# Patient Record
Sex: Female | Born: 1965 | Race: Black or African American | Hispanic: No | Smoking: Never smoker
Health system: Southern US, Community
[De-identification: ages and names within clinical notes are randomized; demographics above are authoritative.]

## PROBLEM LIST (undated history)

## (undated) DIAGNOSIS — N6019 Diffuse cystic mastopathy of unspecified breast: Secondary | ICD-10-CM

## (undated) DIAGNOSIS — E079 Disorder of thyroid, unspecified: Secondary | ICD-10-CM

## (undated) DIAGNOSIS — Q2112 Patent foramen ovale: Secondary | ICD-10-CM

## (undated) HISTORY — PX: OTHER SURGICAL HISTORY: SHX169

---

## 1998-03-12 ENCOUNTER — Other Ambulatory Visit: Admission: RE | Admit: 1998-03-12 | Discharge: 1998-03-12 | Payer: Self-pay | Admitting: Obstetrics & Gynecology

## 1998-08-23 ENCOUNTER — Inpatient Hospital Stay (HOSPITAL_COMMUNITY): Admission: AD | Admit: 1998-08-23 | Discharge: 1998-08-23 | Payer: Self-pay | Admitting: Obstetrics

## 1998-09-15 ENCOUNTER — Inpatient Hospital Stay (HOSPITAL_COMMUNITY): Admission: AD | Admit: 1998-09-15 | Discharge: 1998-09-15 | Payer: Self-pay | Admitting: *Deleted

## 1998-09-15 ENCOUNTER — Inpatient Hospital Stay (HOSPITAL_COMMUNITY): Admission: AD | Admit: 1998-09-15 | Discharge: 1998-09-18 | Payer: Self-pay | Admitting: Obstetrics and Gynecology

## 1999-09-03 ENCOUNTER — Other Ambulatory Visit: Admission: RE | Admit: 1999-09-03 | Discharge: 1999-09-03 | Payer: Self-pay | Admitting: Emergency Medicine

## 2001-03-18 ENCOUNTER — Encounter: Payer: Self-pay | Admitting: Internal Medicine

## 2001-03-18 ENCOUNTER — Emergency Department (HOSPITAL_COMMUNITY): Admission: EM | Admit: 2001-03-18 | Discharge: 2001-03-18 | Payer: Self-pay | Admitting: Emergency Medicine

## 2001-05-11 ENCOUNTER — Encounter: Payer: Self-pay | Admitting: Emergency Medicine

## 2001-05-11 ENCOUNTER — Encounter: Admission: RE | Admit: 2001-05-11 | Discharge: 2001-05-11 | Payer: Self-pay | Admitting: Emergency Medicine

## 2001-08-26 ENCOUNTER — Emergency Department (HOSPITAL_COMMUNITY): Admission: EM | Admit: 2001-08-26 | Discharge: 2001-08-26 | Payer: Self-pay | Admitting: Emergency Medicine

## 2002-11-09 ENCOUNTER — Encounter: Payer: Self-pay | Admitting: Emergency Medicine

## 2002-11-09 ENCOUNTER — Emergency Department (HOSPITAL_COMMUNITY): Admission: EM | Admit: 2002-11-09 | Discharge: 2002-11-09 | Payer: Self-pay | Admitting: Emergency Medicine

## 2003-11-07 ENCOUNTER — Emergency Department (HOSPITAL_COMMUNITY): Admission: EM | Admit: 2003-11-07 | Discharge: 2003-11-07 | Payer: Self-pay | Admitting: Emergency Medicine

## 2003-11-08 ENCOUNTER — Emergency Department (HOSPITAL_COMMUNITY): Admission: EM | Admit: 2003-11-08 | Discharge: 2003-11-08 | Payer: Self-pay | Admitting: Emergency Medicine

## 2004-06-26 ENCOUNTER — Emergency Department (HOSPITAL_COMMUNITY): Admission: EM | Admit: 2004-06-26 | Discharge: 2004-06-26 | Payer: Self-pay | Admitting: Family Medicine

## 2005-07-07 ENCOUNTER — Encounter: Admission: RE | Admit: 2005-07-07 | Discharge: 2005-07-07 | Payer: Self-pay | Admitting: *Deleted

## 2005-07-17 ENCOUNTER — Encounter: Admission: RE | Admit: 2005-07-17 | Discharge: 2005-07-17 | Payer: Self-pay | Admitting: Emergency Medicine

## 2005-07-20 ENCOUNTER — Encounter: Payer: Self-pay | Admitting: Internal Medicine

## 2007-05-03 ENCOUNTER — Encounter: Payer: Self-pay | Admitting: Internal Medicine

## 2007-07-31 ENCOUNTER — Encounter: Payer: Self-pay | Admitting: Internal Medicine

## 2007-08-14 ENCOUNTER — Encounter: Payer: Self-pay | Admitting: Internal Medicine

## 2007-10-11 ENCOUNTER — Encounter: Admission: RE | Admit: 2007-10-11 | Discharge: 2007-10-11 | Payer: Self-pay | Admitting: Emergency Medicine

## 2007-10-24 ENCOUNTER — Encounter: Payer: Self-pay | Admitting: Internal Medicine

## 2007-10-24 ENCOUNTER — Encounter: Admission: RE | Admit: 2007-10-24 | Discharge: 2007-10-24 | Payer: Self-pay | Admitting: Emergency Medicine

## 2008-09-26 ENCOUNTER — Encounter: Payer: Self-pay | Admitting: Internal Medicine

## 2008-10-02 ENCOUNTER — Encounter: Payer: Self-pay | Admitting: Internal Medicine

## 2008-10-02 LAB — CONVERTED CEMR LAB
BUN: 11 mg/dL
Calcium: 9.2 mg/dL
Cholesterol: 138 mg/dL
Creatinine, Ser: 0.69 mg/dL
HCT: 33.6 %
Lymphocytes, automated: 28 %
Monocytes Relative: 9 %
Platelets: 203 10*3/uL
RBC: 3.71 M/uL
Triglyceride fasting, serum: 72 mg/dL

## 2008-12-15 ENCOUNTER — Encounter: Payer: Self-pay | Admitting: Internal Medicine

## 2008-12-15 ENCOUNTER — Encounter: Admission: RE | Admit: 2008-12-15 | Discharge: 2008-12-15 | Payer: Self-pay | Admitting: Emergency Medicine

## 2008-12-26 ENCOUNTER — Ambulatory Visit: Payer: Self-pay | Admitting: Internal Medicine

## 2009-01-22 ENCOUNTER — Encounter: Payer: Self-pay | Admitting: Internal Medicine

## 2009-01-22 DIAGNOSIS — N920 Excessive and frequent menstruation with regular cycle: Secondary | ICD-10-CM

## 2009-01-22 DIAGNOSIS — D259 Leiomyoma of uterus, unspecified: Secondary | ICD-10-CM | POA: Insufficient documentation

## 2009-01-22 DIAGNOSIS — J309 Allergic rhinitis, unspecified: Secondary | ICD-10-CM | POA: Insufficient documentation

## 2009-02-17 ENCOUNTER — Ambulatory Visit: Payer: Self-pay | Admitting: Internal Medicine

## 2009-04-01 ENCOUNTER — Ambulatory Visit: Payer: Self-pay | Admitting: Internal Medicine

## 2009-04-01 DIAGNOSIS — L258 Unspecified contact dermatitis due to other agents: Secondary | ICD-10-CM

## 2009-07-20 ENCOUNTER — Ambulatory Visit: Payer: Self-pay | Admitting: Internal Medicine

## 2009-07-22 ENCOUNTER — Encounter: Payer: Self-pay | Admitting: Internal Medicine

## 2009-12-04 ENCOUNTER — Encounter: Payer: Self-pay | Admitting: Internal Medicine

## 2010-04-06 ENCOUNTER — Ambulatory Visit (HOSPITAL_COMMUNITY): Admission: RE | Admit: 2010-04-06 | Discharge: 2010-04-06 | Payer: Self-pay | Admitting: Internal Medicine

## 2010-10-02 ENCOUNTER — Encounter: Payer: Self-pay | Admitting: Emergency Medicine

## 2010-10-03 ENCOUNTER — Encounter: Payer: Self-pay | Admitting: Emergency Medicine

## 2010-10-10 LAB — CONVERTED CEMR LAB

## 2010-10-12 NOTE — Letter (Signed)
Summary: [facility]  [facility]   Imported By: Sherian Rein 12/09/2009 07:52:54  _____________________________________________________________________  External Attachment:    Type:   Image     Comment:   External Document

## 2011-06-06 ENCOUNTER — Other Ambulatory Visit (HOSPITAL_COMMUNITY): Payer: Self-pay | Admitting: Obstetrics and Gynecology

## 2011-06-06 DIAGNOSIS — Z1231 Encounter for screening mammogram for malignant neoplasm of breast: Secondary | ICD-10-CM

## 2011-06-10 ENCOUNTER — Ambulatory Visit (HOSPITAL_COMMUNITY): Payer: Self-pay | Attending: Obstetrics and Gynecology

## 2011-06-13 ENCOUNTER — Other Ambulatory Visit (HOSPITAL_COMMUNITY): Payer: Self-pay | Admitting: Obstetrics and Gynecology

## 2011-06-13 DIAGNOSIS — Z1231 Encounter for screening mammogram for malignant neoplasm of breast: Secondary | ICD-10-CM

## 2011-06-24 ENCOUNTER — Ambulatory Visit (HOSPITAL_COMMUNITY)
Admission: RE | Admit: 2011-06-24 | Discharge: 2011-06-24 | Disposition: A | Discharge status: Home / Self Care | Payer: Self-pay | Source: Ambulatory Visit | Attending: Obstetrics and Gynecology | Admitting: Obstetrics and Gynecology

## 2011-06-24 DIAGNOSIS — Z1231 Encounter for screening mammogram for malignant neoplasm of breast: Secondary | ICD-10-CM

## 2011-10-07 DIAGNOSIS — Z0289 Encounter for other administrative examinations: Secondary | ICD-10-CM

## 2011-10-07 DIAGNOSIS — Z111 Encounter for screening for respiratory tuberculosis: Secondary | ICD-10-CM

## 2011-10-10 ENCOUNTER — Encounter (INDEPENDENT_AMBULATORY_CARE_PROVIDER_SITE_OTHER): Payer: Self-pay

## 2011-10-10 DIAGNOSIS — Z0389 Encounter for observation for other suspected diseases and conditions ruled out: Secondary | ICD-10-CM

## 2011-12-19 ENCOUNTER — Encounter: Payer: Self-pay | Admitting: Family

## 2011-12-19 ENCOUNTER — Inpatient Hospital Stay (HOSPITAL_COMMUNITY): Payer: Medicaid Other

## 2011-12-19 ENCOUNTER — Inpatient Hospital Stay (HOSPITAL_COMMUNITY)
Admission: AD | Admit: 2011-12-19 | Discharge: 2011-12-19 | Disposition: A | Discharge status: Home / Self Care | Payer: Medicaid Other | Source: Ambulatory Visit | Attending: Obstetrics and Gynecology | Admitting: Obstetrics and Gynecology

## 2011-12-19 ENCOUNTER — Encounter (HOSPITAL_COMMUNITY): Payer: Self-pay | Admitting: *Deleted

## 2011-12-19 DIAGNOSIS — N920 Excessive and frequent menstruation with regular cycle: Secondary | ICD-10-CM

## 2011-12-19 DIAGNOSIS — D259 Leiomyoma of uterus, unspecified: Secondary | ICD-10-CM

## 2011-12-19 DIAGNOSIS — N938 Other specified abnormal uterine and vaginal bleeding: Secondary | ICD-10-CM | POA: Insufficient documentation

## 2011-12-19 DIAGNOSIS — N949 Unspecified condition associated with female genital organs and menstrual cycle: Secondary | ICD-10-CM | POA: Insufficient documentation

## 2011-12-19 DIAGNOSIS — R109 Unspecified abdominal pain: Secondary | ICD-10-CM | POA: Insufficient documentation

## 2011-12-19 HISTORY — DX: Diffuse cystic mastopathy of unspecified breast: N60.19

## 2011-12-19 LAB — URINALYSIS, ROUTINE W REFLEX MICROSCOPIC
Bilirubin Urine: NEGATIVE
Ketones, ur: NEGATIVE mg/dL
Nitrite: NEGATIVE
Protein, ur: NEGATIVE mg/dL
pH: 7.5 (ref 5.0–8.0)

## 2011-12-19 LAB — CBC
HCT: 32 % — ABNORMAL LOW (ref 36.0–46.0)
Hemoglobin: 10.2 g/dL — ABNORMAL LOW (ref 12.0–15.0)
MCH: 28.7 pg (ref 26.0–34.0)
MCHC: 31.9 g/dL (ref 30.0–36.0)
MCV: 89.9 fL (ref 78.0–100.0)
RBC: 3.56 MIL/uL — ABNORMAL LOW (ref 3.87–5.11)

## 2011-12-19 LAB — WET PREP, GENITAL: Clue Cells Wet Prep HPF POC: NONE SEEN

## 2011-12-19 LAB — URINE MICROSCOPIC-ADD ON

## 2011-12-19 MED ORDER — MEDROXYPROGESTERONE ACETATE 10 MG PO TABS: 10.0000 mg | ORAL_TABLET | Freq: Every day | ORAL | Status: DC

## 2011-12-19 NOTE — Discharge Instructions (Signed)
Abnormal Uterine Bleeding Abnormal uterine bleeding can have many causes. Some cases are simply treated, while others are more serious. There are several kinds of bleeding that is considered abnormal, including:  Bleeding between periods.   Bleeding after sexual intercourse.   Spotting anytime in the menstrual cycle.   Bleeding heavier or more than normal.   Bleeding after menopause.  CAUSES  There are many causes of abnormal uterine bleeding. It can be present in teenagers, pregnant women, women during their reproductive years, and women who have reached menopause. Your caregiver will look for the more common causes depending on your age, signs, symptoms and your particular circumstance. Most cases are not serious and can be treated. Even the more serious causes, like cancer of the female organs, can be treated adequately if found in the early stages. That is why all types of bleeding should be evaluated and treated as soon as possible. DIAGNOSIS  Diagnosing the cause may take several kinds of tests. Your caregiver may:  Take a complete history of the type of bleeding.   Perform a complete physical exam and Pap smear.   Take an ultrasound on the abdomen showing a picture of the female organs and the pelvis.   Inject dye into the uterus and Fallopian tubes and X-ray them (hysterosalpingogram).   Place fluid in the uterus and do an ultrasound (sonohysterogrqphy).   Take a CT scan to examine the female organs and pelvis.   Take an MRI to examine the female organs and pelvis. There is no X-ray involved with this procedure.   Look inside the uterus with a telescope that has a light at the end (hysteroscopy).   Scrap the inside of the uterus to get tissue to examine (Dilatation and Curettage, D&C).   Look into the pelvis with a telescope that has a light at the end (laparoscopy). This is done through a very small cut (incision) in the abdomen.  TREATMENT  Treatment will depend on the  cause of the abnormal bleeding. It can include:  Doing nothing to allow the problem to take care of itself over time.   Hormone treatment.   Birth control pills.   Treating the medical condition causing the problem.   Laparoscopy.   Major or minor surgery   Destroying the lining of the uterus with electrical currant, laser, freezing or heat (uterine ablation).  HOME CARE INSTRUCTIONS   Follow your caregiver's recommendation on how to treat your problem.   See your caregiver if you missed a menstrual period and think you may be pregnant.   If you are bleeding heavily, count the number of pads/tampons you use and how often you have to change them. Tell this to your caregiver.   Avoid sexual intercourse until the problem is controlled.  SEEK MEDICAL CARE IF:   You have any kind of abnormal bleeding mentioned above.   You feel dizzy at times.   You are 46 years old and have not had a menstrual period yet.  SEEK IMMEDIATE MEDICAL CARE IF:   You pass out.   You are changing pads/tampons every 15 to 30 minutes.   You have belly (abdominal) pain.   You have a temperature of 100 F (37.8 C) or higher.   You become sweaty or weak.   You are passing large blood clots from the vagina.   You start to feel sick to your stomach (nauseous) and throw up (vomit).  Document Released: 08/29/2005 Document Revised: 08/18/2011 Document Reviewed: 01/22/2009 ExitCare   Patient Information 2012 [employer].  Fibroids You have been diagnosed as having a fibroid. Fibroids are smooth muscle lumps (tumors) which can occur any place in a woman's body. They are usually in the womb (uterus). The most common problem (symptom) of fibroids is bleeding. Over time this may cause low red blood cells (anemia). Other symptoms include feelings of pressure and pain in the pelvis. The diagnosis (learning what is wrong) of fibroids is made by physical exam. Sometimes tests such as an ultrasound are used.  This is helpful when fibroids are felt around the ovaries and to look for tumors. TREATMENT   Most fibroids do not need surgical or medical treatment. Sometimes a tissue sample (biopsy) of the lining of the uterus is done to rule out cancer. If there is no cancer and only a small amount of bleeding, the problem can be watched.   Hormonal treatment can improve the problem.   When surgery is needed, it can consist of removing the fibroid. Vaginal birth may not be possible after the removal of fibroids. This depends on where they are and the extent of surgery. When pregnancy occurs with fibroids it is usually normal.   Your caregiver can help decide which treatments are best for you.  HOME CARE INSTRUCTIONS   Do not use aspirin as this may increase bleeding problems.   If your periods (menses) are heavy, record the number of pads or tampons used per month. Bring this information to your caregiver. This can help them determine the best treatment for you.  SEEK IMMEDIATE MEDICAL CARE IF:  You have pelvic pain or cramps not controlled with medications, or experience a sudden increase in pain.   You have an increase of pelvic bleeding between and during menses.   You feel lightheaded or have fainting spells.   You develop worsening belly (abdominal) pain.  Document Released: 08/26/2000 Document Revised: 08/18/2011 Document Reviewed: 04/17/2008 ExitCare Patient Information 2012 [employer]. 

## 2011-12-19 NOTE — MAU Provider Note (Signed)
History     CSN: [id number]  Arrival date and time: 12/19/11 1220   First Provider Initiated Contact with Patient 12/19/11 1330     46 y.o.G2P1011 Chief Complaint  Patient presents with  . Vaginal Bleeding   HPI Pt presents with heavy periods x1 year, increasing in length and with clots.  Today she presents with increased abdominal pain and clots with current period.  She denies dizziness, h/a, n/v, urinary symptoms, vaginal itching/burning, or fever/chills.   OB History    Grav Para Term Preterm Abortions TAB SAB Ect Mult Living   2 1 1  0 1 1 0 0 0 1      Past Medical History  Diagnosis Date  . Fibrocystic breast disease     Past Surgical History  Procedure Date  . Removal breast mass     Benign    History reviewed. No pertinent family history.  History  Substance Use Topics  . Smoking status: Never Smoker   . Smokeless tobacco: Never Used  . Alcohol Use: No    Allergies: No Known Allergies  Prescriptions prior to admission  Medication Sig Dispense Refill  . Calcium-Vitamin D (CALTRATE 600 PLUS-VIT D PO) Take 1 tablet by mouth daily.      Marland Kitchen ibuprofen (ADVIL,MOTRIN) 200 MG tablet Take 800 mg by mouth every 6 (six) hours as needed. For pain.      . Multiple Vitamin (MULITIVITAMIN WITH MINERALS) TABS Take 1 tablet by mouth daily.        Review of Systems  Constitutional: Negative for fever, chills and malaise/fatigue.  Eyes: Negative for blurred vision.  Respiratory: Negative for cough and shortness of breath.   Cardiovascular: Negative for chest pain.  Gastrointestinal: Positive for abdominal pain. Negative for heartburn, nausea and vomiting.  Genitourinary: Negative for dysuria, urgency and frequency.  Musculoskeletal: Negative.   Neurological: Negative for dizziness and headaches.  Psychiatric/Behavioral: Negative for depression.   Physical Exam   Blood pressure 121/75, pulse 77, temperature 98.1 F (36.7 C), temperature source Oral, resp. rate 18,  height 5\' 8"  (1.727 m), weight 83.689 kg (184 lb 8 oz).  Physical Exam  Nursing note and vitals reviewed. Constitutional: She is oriented to person, place, and time. She appears well-developed and well-nourished.  Neck: Normal range of motion.  Cardiovascular: Normal rate.   Respiratory: Effort normal.  GI: Soft.  Genitourinary:       Pelvic exam: Cervix pink, without lesion, visually closed, large amount bright bleeding from cervical os, vaginal walls and external genitalia normal  Bimanual exam: Cervix 0/long/high, soft, anterior, neg CMT, uterus nontender, nonenlarged, adnexa without enlargement or mass   Musculoskeletal: Normal range of motion.  Neurological: She is alert and oriented to person, place, and time.  Skin: Skin is warm and dry.  Psychiatric: She has a normal mood and affect. Her behavior is normal. Judgment and thought content normal.    MAU Course  Procedures U/A, CBC, Pelvic U/S  Results for orders placed during the hospital encounter of 12/19/11 (from the past 48 hour(s))  URINALYSIS, ROUTINE W REFLEX MICROSCOPIC     Status: Abnormal   Collection Time   12/19/11 12:30 PM      Component Value Range Comment   Color, Urine YELLOW  YELLOW     APPearance CLEAR  CLEAR     Specific Gravity, Urine 1.020  1.005 - 1.030     pH 7.5  5.0 - 8.0     Glucose, UA NEGATIVE  NEGATIVE (mg/dL)  Hgb urine dipstick LARGE (*) NEGATIVE     Bilirubin Urine NEGATIVE  NEGATIVE     Ketones, ur NEGATIVE  NEGATIVE (mg/dL)    Protein, ur NEGATIVE  NEGATIVE (mg/dL)    Urobilinogen, UA 0.2  0.0 - 1.0 (mg/dL)    Nitrite NEGATIVE  NEGATIVE     Leukocytes, UA NEGATIVE  NEGATIVE    URINE MICROSCOPIC-ADD ON     Status: Normal   Collection Time   12/19/11 12:30 PM      Component Value Range Comment   Squamous Epithelial / LPF RARE  RARE     RBC / HPF TOO NUMEROUS TO COUNT  <3 (RBC/hpf)   POCT PREGNANCY, URINE     Status: Normal   Collection Time   12/19/11 12:38 PM      Component Value  Range Comment   Preg Test, Ur NEGATIVE  NEGATIVE    CBC     Status: Abnormal   Collection Time   12/19/11 12:40 PM      Component Value Range Comment   WBC 6.1  4.0 - 10.5 (K/uL)    RBC 3.56 (*) 3.87 - 5.11 (MIL/uL)    Hemoglobin 10.2 (*) 12.0 - 15.0 (g/dL)    HCT 16.1 (*) 09.6 - 46.0 (%)    MCV 89.9  78.0 - 100.0 (fL)    MCH 28.7  26.0 - 34.0 (pg)    MCHC 31.9  30.0 - 36.0 (g/dL)    RDW 04.5  40.9 - 81.1 (%)    Platelets 233  150 - 400 (K/uL)   WET PREP, GENITAL     Status: Abnormal   Collection Time   12/19/11  1:00 PM      Component Value Range Comment   Yeast Wet Prep HPF POC NONE SEEN  NONE SEEN     Trich, Wet Prep NONE SEEN  NONE SEEN     Clue Cells Wet Prep HPF POC NONE SEEN  NONE SEEN     WBC, Wet Prep HPF POC FEW (*) NONE SEEN  FEW BACTERIA SEEN  GC/CHLAMYDIA PROBE AMP, GENITAL     Status: Normal   Collection Time   12/19/11  1:33 PM      Component Value Range Comment   GC Probe Amp, Genital NEGATIVE  NEGATIVE     Chlamydia, DNA Probe NEGATIVE  NEGATIVE     US Transvaginal Non-ob  12/19/2011  *RADIOLOGY REPORT*  Clinical Data: Heavy vaginal bleeding.  Fibroids.  LMP 11/15/2011.  TRANSABDOMINAL AND TRANSVAGINAL ULTRASOUND OF PELVIS  Technique:  Both transabdominal and transvaginal ultrasound examinations of the pelvis were performed.  Transabdominal technique was performed for global imaging of the pelvis including uterus, ovaries, adnexal regions, and pelvic cul-de-sac.  It was necessary to proceed with endovaginal exam following the transabdominal exam to visualize the endometrium and individual fibroids.  Comparison:  None.  Findings: Uterus:  10.2 x 6.2 x 8.1 cm. Diffusely heterogeneous myometrial echotexture is seen, with at least four discretely measurable fibroids.  These range in size from 1.6 cm to 4.4 cm in maximum diameter.  Endometrium: Poorly visualized due to acoustic shadowing from fibroids described above.  Right ovary: 2.4 x 1.4 x 1.2 cm. Normal appearance.  Left  ovary: 3.0 x 1.3 x 1.8 cm.  Normal appearance.  Other Findings:  Trace amount of free fluid noted in pelvic cul-de- sac.  IMPRESSION:  1.  Multiple small uterine fibroids, largest measuring 4.4 cm. 2.  No evidence of adnexal mass.  Original  Report Authenticated By: Danae Orleans, M.D.   US Pelvis Complete  12/19/2011  *RADIOLOGY REPORT*  Clinical Data: Heavy vaginal bleeding.  Fibroids.  LMP 11/15/2011.  TRANSABDOMINAL AND TRANSVAGINAL ULTRASOUND OF PELVIS  Technique:  Both transabdominal and transvaginal ultrasound examinations of the pelvis were performed.  Transabdominal technique was performed for global imaging of the pelvis including uterus, ovaries, adnexal regions, and pelvic cul-de-sac.  It was necessary to proceed with endovaginal exam following the transabdominal exam to visualize the endometrium and individual fibroids.  Comparison:  None.  Findings: Uterus:  10.2 x 6.2 x 8.1 cm. Diffusely heterogeneous myometrial echotexture is seen, with at least four discretely measurable fibroids.  These range in size from 1.6 cm to 4.4 cm in maximum diameter.  Endometrium: Poorly visualized due to acoustic shadowing from fibroids described above.  Right ovary: 2.4 x 1.4 x 1.2 cm. Normal appearance.  Left ovary: 3.0 x 1.3 x 1.8 cm.  Normal appearance.  Other Findings:  Trace amount of free fluid noted in pelvic cul-de- sac.  IMPRESSION:  1.  Multiple small uterine fibroids, largest measuring 4.4 cm. 2.  No evidence of adnexal mass.  Original Report Authenticated By: Danae Orleans, M.D.    Assessment and Plan  A: Uterine fibroids Menorrhagia  P: D/C home with bleeding precautions Provera 10 mg PO QD for 30 days or until f/u F/U at [facility] clinic (message sent) Return to MAU as needed  LEFTWICH-KIRBY, Athony Coppa 12/19/2011, 1:44 PM

## 2011-12-19 NOTE — MAU Note (Signed)
Patient is here with c/o vaginal bleeding for one year, heacier today with clots. She states that she is having mild cramping. Denies dizziness or n/v.

## 2011-12-20 LAB — GC/CHLAMYDIA PROBE AMP, GENITAL: Chlamydia, DNA Probe: NEGATIVE

## 2011-12-21 NOTE — MAU Provider Note (Signed)
Agree with above note.  Shelly Dickson 12/21/2011 8:40 AM

## 2012-01-25 ENCOUNTER — Other Ambulatory Visit (HOSPITAL_COMMUNITY)
Admission: RE | Admit: 2012-01-25 | Discharge: 2012-01-25 | Disposition: A | Discharge status: Home / Self Care | Payer: Self-pay | Source: Ambulatory Visit | Attending: Family | Admitting: Family

## 2012-01-25 ENCOUNTER — Ambulatory Visit (INDEPENDENT_AMBULATORY_CARE_PROVIDER_SITE_OTHER): Payer: Self-pay | Admitting: Family

## 2012-01-25 ENCOUNTER — Encounter: Payer: Self-pay | Admitting: Family

## 2012-01-25 VITALS — BP 108/71 | HR 69 | Temp 98.4°F | Ht 68.5 in | Wt 184.7 lb

## 2012-01-25 DIAGNOSIS — Z01812 Encounter for preprocedural laboratory examination: Secondary | ICD-10-CM

## 2012-01-25 DIAGNOSIS — R58 Hemorrhage, not elsewhere classified: Secondary | ICD-10-CM | POA: Insufficient documentation

## 2012-01-25 LAB — POCT PREGNANCY, URINE: Preg Test, Ur: NEGATIVE

## 2012-01-25 MED ORDER — NORGESTIMATE-ETH ESTRADIOL 0.25-35 MG-MCG PO TABS: 1.0000 | ORAL_TABLET | Freq: Every day | ORAL | Status: DC

## 2012-01-25 NOTE — Progress Notes (Signed)
  Subjective:    Shelly Dickson is a 46 y.o. female who presents with uterine fibroids. Periods are regular every 28-30 days, lasting 5 days. Dysmenorrhea:severe, occurring first 1-2 days of flow. Cyclic symptoms include bloating and irritability. No intermenstrual bleeding, spotting, or discharge.  Current contraception: none History of abnormal Pap smear: no Family history of uterine or ovarian cancer: no Regular self breast exam: not every month History of abnormal mammogram: no Family history of breast cancer: yes - maternal grandmother (22's) History of abnormal lipids: no  Menstrual History: OB History    Grav Para Term Preterm Abortions TAB SAB Ect Mult Living   2 1 1  0 1 1 0 0 0 1      Menarche age: 41 Patient's last menstrual period was 01/09/2012.    The following portions of the patient's history were reviewed and updated as appropriate: allergies, current medications, past family history, past medical history, past social history, past surgical history and problem list.  Review of Systems Pertinent items are noted in HPI.    Objective:     BP 108/71  Pulse 69  Temp(Src) 98.4 F (36.9 C) (Oral)  Ht 5' 8.5" (1.74 m)  Wt 184 lb 11.2 oz (83.779 kg)  BMI 27.67 kg/m2  LMP 01/09/2012 BP 108/71  Pulse 69  Temp(Src) 98.4 F (36.9 C) (Oral)  Ht 5' 8.5" (1.74 m)  Wt 184 lb 11.2 oz (83.779 kg)  BMI 27.67 kg/m2  LMP 01/09/2012 General appearance: alert, cooperative and appears stated age Abdomen: normal findings: soft, non-tender Pelvic: cervix normal in appearance, external genitalia normal, no adnexal masses or tenderness, no cervical motion tenderness, rectovaginal septum normal, vagina normal without discharge and uterus slightly enlarge. Extremities: extremities normal, atraumatic, no cyanosis or edema    HCG - negative  Patient given informed consent, signed copy in the chart, time out was performed. Appropriate time out taken. . The patient was placed in the  lithotomy position and the cervix brought into view with sterile speculum.  Portio of cervix cleansed x 2 with betadine swabs. The uterus was sounded for depth of 8. A pipelle was introduced to into the uterus, suction created,  and an endometrial sample was obtained. All equipment was removed and accounted for.  The patient tolerated the procedure well.    Patient given post procedure instructions. The patient will return in 2 weeks for results.  Assessment:    Symptomatic uterine fibroids.     Plan:    Endometrial biopsy - see separate procedure note. Follow up in 2 weeks. RX Sprintec   [facility]

## 2012-01-25 NOTE — Patient Instructions (Signed)
Endometrial Biopsy This is a test in which a tissue sample (a biopsy) is taken from inside the uterus (womb). It is then looked at by a specialist under a microscope to see if the tissue is normal or abnormal. The endometrium is the lining of the uterus. This test helps determine where you are in your menstrual cycle and how hormone levels are affecting the lining of the uterus. Another use for this test is to diagnose endometrial cancer, tuberculosis, polyps, or inflammatory conditions and to evaluate uterine bleeding. PREPARATION FOR TEST No preparation or fasting is necessary. NORMAL FINDINGS No pathologic conditions. Presence of "secretory-type" endometrium 3 to 5 days before to normal menstruation. Ranges for normal findings may vary among different laboratories and hospitals. You should always check with your doctor after having lab work or other tests done to discuss the meaning of your test results and whether your values are considered within normal limits. MEANING OF TEST  Your caregiver will go over the test results with you and discuss the importance and meaning of your results, as well as treatment options and the need for additional tests if necessary. OBTAINING THE TEST RESULTS It is your responsibility to obtain your test results. Ask the lab or department performing the test when and how you will get your results. Document Released: 12/30/2004 Document Revised: 08/18/2011 Document Reviewed: 08/08/2008 ExitCare Patient Information 2012 [employer]. 

## 2012-01-25 NOTE — Progress Notes (Signed)
Patient only took Provera for 4 days after prescribed. States it caused her to feel "nervous, hot sweats, very fatigued"

## 2012-02-02 ENCOUNTER — Emergency Department (HOSPITAL_COMMUNITY)
Admission: EM | Admit: 2012-02-02 | Discharge: 2012-02-02 | Disposition: A | Discharge status: Home / Self Care | Payer: Self-pay | Attending: Emergency Medicine | Admitting: Emergency Medicine

## 2012-02-02 ENCOUNTER — Telehealth: Payer: Self-pay | Admitting: *Deleted

## 2012-02-02 ENCOUNTER — Encounter (HOSPITAL_COMMUNITY): Payer: Self-pay | Admitting: Emergency Medicine

## 2012-02-02 DIAGNOSIS — L0390 Cellulitis, unspecified: Secondary | ICD-10-CM

## 2012-02-02 DIAGNOSIS — L0201 Cutaneous abscess of face: Secondary | ICD-10-CM | POA: Insufficient documentation

## 2012-02-02 DIAGNOSIS — H9209 Otalgia, unspecified ear: Secondary | ICD-10-CM | POA: Insufficient documentation

## 2012-02-02 DIAGNOSIS — R51 Headache: Secondary | ICD-10-CM | POA: Insufficient documentation

## 2012-02-02 DIAGNOSIS — L03211 Cellulitis of face: Secondary | ICD-10-CM | POA: Insufficient documentation

## 2012-02-02 MED ORDER — HYDROCODONE-ACETAMINOPHEN 5-325 MG PO TABS
1.0000 | ORAL_TABLET | Freq: Once | ORAL | Status: AC
  Administered 2012-02-02: 1 via ORAL
  Filled 2012-02-02: qty 1

## 2012-02-02 MED ORDER — CEPHALEXIN 250 MG PO CAPS: 250.0000 mg | ORAL_CAPSULE | Freq: Four times a day (QID) | ORAL | Status: AC

## 2012-02-02 MED ORDER — CEPHALEXIN 250 MG PO CAPS
250.0000 mg | ORAL_CAPSULE | Freq: Once | ORAL | Status: AC
  Administered 2012-02-02: 250 mg via ORAL
  Filled 2012-02-02: qty 1

## 2012-02-02 MED ORDER — HYDROCODONE-ACETAMINOPHEN 5-325 MG PO TABS: 1.0000 | ORAL_TABLET | Freq: Once | ORAL | Status: AC

## 2012-02-02 NOTE — ED Notes (Addendum)
Tuesday morning she started having swollen ears. Swelling noted with no discoloration on the right ear and right lateral cheek near the ear. Tympanic membrane inspected. Normal ear wax amount noted. No pink or redness. Clear/pearly gray in color.

## 2012-02-02 NOTE — Telephone Encounter (Signed)
Patient called stating would like call back and has a question. No other information given

## 2012-02-02 NOTE — Telephone Encounter (Signed)
Called patient back. She had a questions about her early onset of menstration post endometrial biopsy. Stated that the bleed was in fact a menstration; it was not bright red, states she also had a few clumps of blood and a light cramp. Advised patient that early menstration is ok and as long as she's not changing her pad more than 4 times an hour that she should be ok. Patient states her bleed has slowed down now and will just address any changes on her next f/u appt on 02/24/12. Patient satisfied.

## 2012-02-02 NOTE — ED Provider Notes (Signed)
Medical screening examination/treatment/procedure(s) were performed by non-physician practitioner and as supervising physician I was immediately available for consultation/collaboration.  Dietrich Ke, MD 02/02/12 [id number] 

## 2012-02-02 NOTE — Discharge Instructions (Signed)
Cellulitis Cellulitis is an infection of the skin and the tissue beneath it. The area is typically red and tender. It is caused by germs (bacteria) (usually staph or strep) that enter the body through cuts or sores. Cellulitis most commonly occurs in the arms or lower legs.  HOME CARE INSTRUCTIONS   If you are given a prescription for medications which kill germs (antibiotics), take as directed until finished.   If the infection is on the arm or leg, keep the limb elevated as able.   Use a warm cloth several times per day to relieve pain and encourage healing.   See your caregiver for recheck of the infected site as directed if problems arise.   Only take over-the-counter or prescription medicines for pain, discomfort, or fever as directed by your caregiver.  SEEK MEDICAL CARE IF:   The area of redness (inflammation) is spreading, there are red streaks coming from the infected site, or if a part of the infection begins to turn dark in color.   The joint or bone underneath the infected skin becomes painful after the skin has healed.   The infection returns in the same or another area after it seems to have gone away.   A boil or bump swells up. This may be an abscess.   New, unexplained problems such as pain or fever develop.  SEEK IMMEDIATE MEDICAL CARE IF:   You have a fever.   You or your child feels drowsy or lethargic.   There is vomiting, diarrhea, or lasting discomfort or feeling ill (malaise) with muscle aches and pains.  MAKE SURE YOU:   Understand these instructions.   Will watch your condition.   Will get help right away if you are not doing well or get worse.  Document Released: 06/08/2005 Document Revised: 08/18/2011 Document Reviewed: 04/16/2008 ExitCare Patient Information 2012 [employer].Skin Infections A skin infection usually develops as a result of disruption of the skin barrier.  CAUSES  A skin infection might occur following:  Trauma or an injury  to the skin such as a cut or insect sting.   Inflammation (as in eczema).   Breaks in the skin between the toes (as in athlete's foot).   Swelling (edema).  SYMPTOMS  The legs are the most common site affected. Usually there is:  Redness.   Swelling.   Pain.   There may be red streaks in the area of the infection.  TREATMENT   Minor skin infections may be treated with topical antibiotics, but if the skin infection is severe, hospital care and intravenous (IV) antibiotic treatment may be needed.   Most often skin infections can be treated with oral antibiotic medicine as well as proper rest and elevation of the affected area until the infection improves.   If you are prescribed oral antibiotics, it is important to take them as directed and to take all the pills even if you feel better before you have finished all of the medicine.   You may apply warm compresses to the area for 20-30 minutes 4 times daily.  You might need a tetanus shot now if:  You have no idea when you had the last one.   You have never had a tetanus shot before.   Your wound had dirt in it.  If you need a tetanus shot and you decide not to get one, there is a rare chance of getting tetanus. Sickness from tetanus can be serious. If you get a tetanus shot, your   arm may swell and become red and warm at the shot site. This is common and not a problem. SEEK MEDICAL CARE IF:  The pain and swelling from your infection do not improve within 2 days.  SEEK IMMEDIATE MEDICAL CARE IF:  You develop a fever, chills, or other serious problems.  Document Released: 10/06/2004 Document Revised: 08/18/2011 Document Reviewed: 08/18/2008 ExitCare Patient Information 2012 [employer]. 

## 2012-02-02 NOTE — ED Provider Notes (Signed)
History     CSN: [id number]  Arrival date & time 02/02/12  2010   First MD Initiated Contact with Patient 02/02/12 2136      Chief Complaint  Patient presents with  . Otalgia    (Consider location/radiation/quality/duration/timing/severity/associated sxs/prior treatment) HPI Comments: Patient here with a two day history of right ear pain - she reports that the pain started on Tuesday, was there when she awoke.  She reports pain to the tragus of the right ear and into the right face with redness and swelling - reports pain with opening and closing her jaw.  Denies fever, chills, drainage from the ear, reports headache and pain behind her right eye.  Denies any vision changes but reports decrease in hearing on the right.  Patient is a 46 y.o. female presenting with ear pain. The history is provided by the patient. No language interpreter was used.  Otalgia This is a new problem. The current episode started 2 days ago. There is pain in the right ear. The problem occurs constantly. The problem has been gradually worsening. There has been no fever. The pain is at a severity of 10/10. The pain is severe. Associated symptoms include headaches and hearing loss. Pertinent negatives include no ear discharge, no rhinorrhea, no sore throat, no abdominal pain, no diarrhea, no vomiting, no neck pain, no cough and no rash. Her past medical history does not include chronic ear infection, hearing loss or tympanostomy tube.    Past Medical History  Diagnosis Date  . Fibrocystic breast disease     Past Surgical History  Procedure Date  . Removal breast mass     Benign    Family History  Problem Relation Age of Onset  . Breast cancer Paternal Grandmother   . Breast cancer Maternal Grandmother   . Hypertension Maternal Grandmother   . Diabetes Maternal Grandfather   . Hypertension Maternal Grandfather   . Hypertension Brother   . Hypertension Sister     History  Substance Use Topics  .  Smoking status: Never Smoker   . Smokeless tobacco: Never Used  . Alcohol Use: No    OB History    Grav Para Term Preterm Abortions TAB SAB Ect Mult Living   2 1 1  0 1 1 0 0 0 1      Review of Systems  HENT: Positive for hearing loss and ear pain. Negative for sore throat, rhinorrhea, neck pain and ear discharge.   Respiratory: Negative for cough.   Gastrointestinal: Negative for vomiting, abdominal pain and diarrhea.  Skin: Negative for rash.  Neurological: Positive for headaches.  All other systems reviewed and are negative.    Allergies  Chocolate  Home Medications   Current Outpatient Rx  Name Route Sig Dispense Refill  . CALTRATE 600 PLUS-VIT D PO Oral Take 1 tablet by mouth daily.    . IBUPROFEN 200 MG PO TABS Oral Take 800 mg by mouth every 6 (six) hours as needed. For pain.    Marland Kitchen MEDROXYPROGESTERONE ACETATE 10 MG PO TABS Oral Take 1 tablet (10 mg total) by mouth daily. 30 tablet 0  . ADULT MULTIVITAMIN W/MINERALS CH Oral Take 1 tablet by mouth daily.    Marland Kitchen NORGESTIMATE-ETH ESTRADIOL 0.25-35 MG-MCG PO TABS Oral Take 1 tablet by mouth daily. 1 Package 3    BP 124/69  Pulse 68  Temp(Src) 98 F (36.7 C) (Oral)  SpO2 100%  LMP 01/09/2012  Physical Exam  Nursing note and vitals reviewed. Constitutional: She  is oriented to person, place, and time. She appears well-developed and well-nourished.       crying  HENT:  Head: Normocephalic and atraumatic.    Right Ear: Tympanic membrane and ear canal normal. There is swelling and tenderness. Tympanic membrane is not erythematous and not bulging. No middle ear effusion. No hemotympanum. Decreased hearing is noted.  Left Ear: External ear normal.  Ears:  Nose: Nose normal.  Mouth/Throat: Oropharynx is clear and moist and mucous membranes are normal. Normal dentition. No oropharyngeal exudate.  Eyes: Conjunctivae are normal. Pupils are equal, round, and reactive to light. No scleral icterus.  Neck: Normal range of  motion. Neck supple.  Cardiovascular: Normal rate, regular rhythm and normal heart sounds.  Exam reveals no gallop and no friction rub.   No murmur heard. Pulmonary/Chest: Effort normal and breath sounds normal. No respiratory distress.  Abdominal: Soft. She exhibits no distension. There is no tenderness.  Musculoskeletal: Normal range of motion. She exhibits no edema and no tenderness.  Lymphadenopathy:    She has no cervical adenopathy.  Neurological: She is alert and oriented to person, place, and time. No cranial nerve deficit. She exhibits normal muscle tone. Coordination normal.  Skin: Skin is warm and dry. No rash noted. There is erythema. No pallor.  Psychiatric: She has a normal mood and affect. Her behavior is normal. Judgment and thought content normal.    ED Course  Procedures (including critical care time)  Labs Reviewed - No data to display No results found.   Right facial cellulitis    MDM  Patient with erythema to the skin near the tragus of the ear and extend to the face itself.  Because of this, I believe this to be more cellulitis, there is no evidence of abscess formation, drainage from the ear or erythema to the TM.  There is also no dental pain as well.        Izola Price [place], [place] 02/02/12 2215

## 2012-02-24 ENCOUNTER — Ambulatory Visit (INDEPENDENT_AMBULATORY_CARE_PROVIDER_SITE_OTHER): Payer: Self-pay | Admitting: Advanced Practice Midwife

## 2012-02-24 VITALS — BP 108/62 | HR 59 | Temp 97.4°F | Ht 68.5 in | Wt 184.1 lb

## 2012-02-24 DIAGNOSIS — D219 Benign neoplasm of connective and other soft tissue, unspecified: Secondary | ICD-10-CM

## 2012-02-24 DIAGNOSIS — N920 Excessive and frequent menstruation with regular cycle: Secondary | ICD-10-CM

## 2012-02-24 DIAGNOSIS — D259 Leiomyoma of uterus, unspecified: Secondary | ICD-10-CM

## 2012-02-24 MED ORDER — NORGESTIMATE-ETH ESTRADIOL 0.25-35 MG-MCG PO TABS: 1.0000 | ORAL_TABLET | Freq: Every day | ORAL | Status: DC

## 2012-02-28 ENCOUNTER — Encounter: Payer: Self-pay | Admitting: Advanced Practice Midwife

## 2012-02-28 NOTE — Progress Notes (Signed)
  Subjective:    Patient ID: Shelly Dickson, female    DOB: 07/10/1966, 46 y.o.   MRN: [id number]  HPI: Pt is here for results of EBX. Pt is non-smoker. Denies Hx of blood clots.    Review of Systems: Deferred     Objective:   Physical Exam: Deferred BP 108/62  Pulse 59  Temp 97.4 F (36.3 C)  Ht 5' 8.5" (1.74 m)  Wt 83.507 kg (184 lb 1.6 oz)  BMI 27.59 kg/m2  LMP 02/22/2012  EBX: Benign secretory endometrium. No hyperplasia, atypia or malignancy.      Assessment & Plan:  Discussed management options for menorrhagia including hormones, ablation, myomectomy and hysterectomy.  1. Menorrhagia  norgestimate-ethinyl estradiol (ORTHO-CYCLEN,SPRINTEC,PREVIFEM) 0.25-35 MG-MCG tablet  2. Fibroids    Will try OCPs, but discussed that this is not ideal long-term management in women over 35.  Bleeding and blood clot precautions F/U in 3 months or PRN.  Dorathy Kinsman 02/24/12

## 2012-06-28 ENCOUNTER — Encounter (HOSPITAL_COMMUNITY): Payer: Self-pay | Admitting: *Deleted

## 2012-06-28 ENCOUNTER — Emergency Department (HOSPITAL_COMMUNITY)
Admission: EM | Admit: 2012-06-28 | Discharge: 2012-06-28 | Disposition: A | Discharge status: Home / Self Care | Payer: Medicaid Other | Attending: Emergency Medicine | Admitting: Emergency Medicine

## 2012-06-28 DIAGNOSIS — M79609 Pain in unspecified limb: Secondary | ICD-10-CM | POA: Insufficient documentation

## 2012-06-28 DIAGNOSIS — L0390 Cellulitis, unspecified: Secondary | ICD-10-CM

## 2012-06-28 DIAGNOSIS — L02519 Cutaneous abscess of unspecified hand: Secondary | ICD-10-CM | POA: Insufficient documentation

## 2012-06-28 MED ORDER — IBUPROFEN 600 MG PO TABS: 600.0000 mg | ORAL_TABLET | Freq: Four times a day (QID) | ORAL | Status: DC | PRN

## 2012-06-28 MED ORDER — CEPHALEXIN 500 MG PO CAPS: 500.0000 mg | ORAL_CAPSULE | Freq: Four times a day (QID) | ORAL | Status: DC

## 2012-06-28 MED ORDER — CEPHALEXIN 500 MG PO CAPS
500.0000 mg | ORAL_CAPSULE | Freq: Once | ORAL | Status: AC
  Administered 2012-06-28: 500 mg via ORAL
  Filled 2012-06-28: qty 1

## 2012-06-28 MED ORDER — TRAMADOL HCL 50 MG PO TABS: 50.0000 mg | ORAL_TABLET | Freq: Four times a day (QID) | ORAL | Status: DC | PRN

## 2012-06-28 NOTE — ED Notes (Addendum)
Pt reports unknown insect bite to L posterior hand yesterday while doing yard work.  Pt presents with reddened swollen L hand, with tingling and numbness.  Pt also reports warmth to touch.  Pt also reports feeling "bad" and h/a which started yesterday evening.

## 2012-06-28 NOTE — ED Provider Notes (Signed)
History     CSN: [id number]  Arrival date & time 06/28/12  1342   First MD Initiated Contact with Patient 06/28/12 1506      Chief Complaint  Patient presents with  . Hand Pain    (Consider location/radiation/quality/duration/timing/severity/associated sxs/prior treatment) HPI Comments: Patient reports being 'stung' by unknown insect approx 24 hrs ago. Initially had pain, but now c/o redness, warmth, and aching of dorsum of L wrist. She can move wrist with some mild pain. Also reports 'tingling' in fingers. Applied topical medicine which helped PTA. She 'felt warm' this morning. No N/V. Onset acute. Course gradually worsening.   Patient is a 46 y.o. female presenting with hand pain. The history is provided by the patient.  Hand Pain Associated symptoms include a fever (subjective), myalgias, numbness (tingling) and a rash. Pertinent negatives include no abdominal pain, chest pain, coughing, headaches, nausea, neck pain or vomiting.    Past Medical History  Diagnosis Date  . Fibrocystic breast disease     Past Surgical History  Procedure Date  . Removal breast mass     Benign    Family History  Problem Relation Age of Onset  . Breast cancer Paternal Grandmother   . Breast cancer Maternal Grandmother   . Hypertension Maternal Grandmother   . Diabetes Maternal Grandfather   . Hypertension Maternal Grandfather   . Hypertension Brother   . Hypertension Sister     History  Substance Use Topics  . Smoking status: Never Smoker   . Smokeless tobacco: Never Used  . Alcohol Use: No    OB History    Grav Para Term Preterm Abortions TAB SAB Ect Mult Living   2 1 1  0 1 1 0 0 0 1      Review of Systems  Constitutional: Positive for fever (subjective).  HENT: Negative for rhinorrhea, neck pain and neck stiffness.   Eyes: Negative for redness.  Respiratory: Negative for cough.   Cardiovascular: Negative for chest pain.  Gastrointestinal: Negative for nausea, vomiting,  abdominal pain and diarrhea.  Musculoskeletal: Positive for myalgias.  Skin: Positive for rash.  Neurological: Positive for numbness (tingling). Negative for headaches.  Hematological: Negative for adenopathy.    Allergies  Chocolate  Home Medications   Current Outpatient Rx  Name Route Sig Dispense Refill  . ACETAMINOPHEN 325 MG PO TABS Oral Take 650 mg by mouth every 6 (six) hours as needed. Pain    . CALTRATE 600 PLUS-VIT D PO Oral Take 1 tablet by mouth daily.    . IBUPROFEN 200 MG PO TABS Oral Take 400 mg by mouth every 6 (six) hours as needed. For pain.    . ADULT MULTIVITAMIN W/MINERALS CH Oral Take 1 tablet by mouth daily.    Marland Kitchen NORGESTIMATE-ETH ESTRADIOL 0.25-35 MG-MCG PO TABS Oral Take 1 tablet by mouth daily.      BP 129/76  Pulse 69  Temp 98.8 F (37.1 C) (Oral)  Resp 20  SpO2 100%  LMP 06/14/2012  Physical Exam  Nursing note and vitals reviewed. Constitutional: She is oriented to person, place, and time. She appears well-developed and well-nourished.  HENT:  Head: Normocephalic and atraumatic.  Eyes: Pupils are equal, round, and reactive to light.  Neck: Normal range of motion. Neck supple.       No meningismus  Cardiovascular: Exam reveals no decreased pulses.   Pulses:      Radial pulses are 2+ on the right side, and 2+ on the left side.  Musculoskeletal: She  exhibits tenderness. She exhibits no edema.       Left elbow: Normal.       Arms:      Left hand: She exhibits normal range of motion and normal capillary refill. decreased sensation (generalized tingling of fingers, no cyanosis) noted. Normal strength noted.  Neurological: She is alert and oriented to person, place, and time. No sensory deficit.       Motor, sensation, and vascular distal to the injury is fully intact.   Skin: Skin is warm and dry.  Psychiatric: She has a normal mood and affect.    ED Course  Procedures (including critical care time)  Labs Reviewed - No data to display No  results found.   1. Cellulitis     3:16 PM Patient seen and examined. Medications ordered.   Vital signs reviewed and are as follows: Filed Vitals:   06/28/12 1421  BP: 129/76  Pulse: 69  Temp: 98.8 F (37.1 C)  Resp: 20   Pt urged to return with worsening pain, worsening swelling, expanding area of redness or streaking up extremity, fever, or any other concerns. Urged to take complete course of antibiotics as prescribed. Counseled to take pain medications as prescribed. Pt verbalizes understanding and agrees with plan.  Patient to return in 24-48 hrs for recheck, sooner if worsening.   MDM  Cellulitis. No signs of compartment syndrome. Some tingling in fingers. Do not suspect significant neuro problem, full ROM in wrist and fingers. Normal pulse and cap refill. No concern for septic arthritis. Will treat and have patient return for recheck. HA -- no concern for meningitis. No concern for tick borne illness.        Renne Crigler, [place] 06/28/12 1526

## 2012-06-28 NOTE — ED Notes (Signed)
Pt verbalizes undaerstanding

## 2012-06-30 ENCOUNTER — Emergency Department (HOSPITAL_COMMUNITY)
Admission: EM | Admit: 2012-06-30 | Discharge: 2012-06-30 | Disposition: A | Discharge status: Home / Self Care | Payer: Medicaid Other | Attending: Emergency Medicine | Admitting: Emergency Medicine

## 2012-06-30 ENCOUNTER — Encounter (HOSPITAL_COMMUNITY): Payer: Self-pay | Admitting: *Deleted

## 2012-06-30 DIAGNOSIS — L03119 Cellulitis of unspecified part of limb: Secondary | ICD-10-CM

## 2012-06-30 DIAGNOSIS — L02519 Cutaneous abscess of unspecified hand: Secondary | ICD-10-CM | POA: Insufficient documentation

## 2012-06-30 DIAGNOSIS — Z91018 Allergy to other foods: Secondary | ICD-10-CM | POA: Insufficient documentation

## 2012-06-30 NOTE — ED Provider Notes (Signed)
Medical screening examination/treatment/procedure(s) were performed by non-physician practitioner and as supervising physician I was immediately available for consultation/collaboration.   Hurman Horn, MD 06/30/12 [phone]

## 2012-06-30 NOTE — ED Notes (Signed)
Pt here for re-check of "spider bite" to L hand. Pt states her hand is still swollen but getting much better. Pt states she was having headaches, but they have also gotten better.

## 2012-06-30 NOTE — ED Provider Notes (Signed)
History     CSN: [id number]  Arrival date & time 06/30/12  2044   First MD Initiated Contact with Patient 06/30/12 2139      Chief Complaint  Patient presents with  . Follow-up   HPI  Provided by the patient. She is a 46 year old female who returns for a recheck of left hand cellulitis. Patient was seen 2 days ago and given prescriptions for Keflex to treat infection. She reports she initially had significant swelling of the hand with erythema and heat. She was unable to fully flex or make a fist due to the pain and swelling. Patient has been taking the antibiotic as prescribed and reports swelling has gone down significantly as well as erythema and warmth. Patient denies having any fever, chills or sweats symptoms. There has been no erythematous streaks up the arm.    Past Medical History  Diagnosis Date  . Fibrocystic breast disease     Past Surgical History  Procedure Date  . Removal breast mass     Benign    Family History  Problem Relation Age of Onset  . Breast cancer Paternal Grandmother   . Breast cancer Maternal Grandmother   . Hypertension Maternal Grandmother   . Diabetes Maternal Grandfather   . Hypertension Maternal Grandfather   . Hypertension Brother   . Hypertension Sister     History  Substance Use Topics  . Smoking status: Never Smoker   . Smokeless tobacco: Never Used  . Alcohol Use: No    OB History    Grav Para Term Preterm Abortions TAB SAB Ect Mult Living   2 1 1  0 1 1 0 0 0 1      Review of Systems  All other systems reviewed and are negative.    Allergies  Chocolate  Home Medications   Current Outpatient Rx  Name Route Sig Dispense Refill  . ACETAMINOPHEN 325 MG PO TABS Oral Take 650 mg by mouth every 6 (six) hours as needed. Pain    . CALTRATE 600 PLUS-VIT D PO Oral Take 1 tablet by mouth daily.    . CEPHALEXIN 500 MG PO CAPS Oral Take 1 capsule (500 mg total) by mouth 4 (four) times daily. 28 capsule 0  . IBUPROFEN 600 MG  PO TABS Oral Take 1 tablet (600 mg total) by mouth every 6 (six) hours as needed for pain. 20 tablet 0  . ADULT MULTIVITAMIN W/MINERALS CH Oral Take 1 tablet by mouth daily.    Marland Kitchen NORGESTIMATE-ETH ESTRADIOL 0.25-35 MG-MCG PO TABS Oral Take 1 tablet by mouth daily.    . TRAMADOL HCL 50 MG PO TABS Oral Take 1 tablet (50 mg total) by mouth every 6 (six) hours as needed for pain. 15 tablet 0    BP 113/55  Pulse 58  Temp 98.8 F (37.1 C) (Oral)  Resp 15  SpO2 100%  LMP 06/14/2012  Physical Exam  Nursing note and vitals reviewed. Constitutional: She is oriented to person, place, and time. She appears well-developed and well-nourished. No distress.  HENT:  Head: Normocephalic.  Cardiovascular: Normal rate and regular rhythm.   Pulmonary/Chest: Effort normal and breath sounds normal.  Musculoskeletal:       Welling and erythema to left hand. Full range of motion. Normal sensations to the fingertips with normal cap refill. Slight increased warmth to the hand. No erythematous streaks up the arm. No significant induration.  Neurological: She is alert and oriented to person, place, and time.  Skin: Skin is  warm and dry.  Psychiatric: She has a normal mood and affect. Her behavior is normal.    ED Course  Procedures     1. Cellulitis of hand       MDM  9:40 PM patient seen and evaluated. He reports having significant improvements of swelling and infection. Denies any fever, chills or sweats. Erythema and warmth have decreased.        Angus Seller, [place] 06/30/12 2208

## 2012-07-02 NOTE — ED Provider Notes (Signed)
Medical screening examination/treatment/procedure(s) were performed by non-physician practitioner and as supervising physician I was immediately available for consultation/collaboration.  Bevelyn Arriola R. Denard Tuminello, MD 07/02/12 0012 

## 2012-07-11 ENCOUNTER — Other Ambulatory Visit: Payer: Self-pay | Admitting: Nurse Practitioner

## 2012-07-11 DIAGNOSIS — Z1231 Encounter for screening mammogram for malignant neoplasm of breast: Secondary | ICD-10-CM

## 2012-08-14 ENCOUNTER — Ambulatory Visit
Admission: RE | Admit: 2012-08-14 | Discharge: 2012-08-14 | Disposition: A | Discharge status: Home / Self Care | Payer: Medicaid Other | Source: Ambulatory Visit | Attending: Nurse Practitioner | Admitting: Nurse Practitioner

## 2012-08-14 DIAGNOSIS — Z1231 Encounter for screening mammogram for malignant neoplasm of breast: Secondary | ICD-10-CM

## 2012-09-07 ENCOUNTER — Encounter: Payer: Medicaid Other | Admitting: Obstetrics & Gynecology

## 2013-10-07 ENCOUNTER — Other Ambulatory Visit: Payer: Self-pay

## 2013-10-07 DIAGNOSIS — Z1231 Encounter for screening mammogram for malignant neoplasm of breast: Secondary | ICD-10-CM

## 2013-10-28 ENCOUNTER — Ambulatory Visit
Admission: RE | Admit: 2013-10-28 | Discharge: 2013-10-28 | Disposition: A | Discharge status: Home / Self Care | Payer: Medicaid Other | Source: Ambulatory Visit

## 2013-10-28 DIAGNOSIS — Z1231 Encounter for screening mammogram for malignant neoplasm of breast: Secondary | ICD-10-CM

## 2014-07-14 ENCOUNTER — Encounter (HOSPITAL_COMMUNITY): Payer: Self-pay | Admitting: *Deleted

## 2014-12-01 ENCOUNTER — Ambulatory Visit
Admission: RE | Admit: 2014-12-01 | Discharge: 2014-12-01 | Disposition: A | Discharge status: Home / Self Care | Payer: Medicaid Other | Source: Ambulatory Visit

## 2014-12-01 ENCOUNTER — Other Ambulatory Visit: Payer: Self-pay

## 2014-12-01 DIAGNOSIS — Z1231 Encounter for screening mammogram for malignant neoplasm of breast: Secondary | ICD-10-CM

## 2014-12-23 ENCOUNTER — Emergency Department (HOSPITAL_COMMUNITY)
Admission: EM | Admit: 2014-12-23 | Discharge: 2014-12-23 | Disposition: A | Discharge status: Home / Self Care | Payer: No Typology Code available for payment source | Attending: Emergency Medicine | Admitting: Emergency Medicine

## 2014-12-23 ENCOUNTER — Encounter (HOSPITAL_COMMUNITY): Payer: Self-pay

## 2014-12-23 DIAGNOSIS — M545 Low back pain, unspecified: Secondary | ICD-10-CM

## 2014-12-23 DIAGNOSIS — S3992XA Unspecified injury of lower back, initial encounter: Secondary | ICD-10-CM | POA: Insufficient documentation

## 2014-12-23 DIAGNOSIS — Y9389 Activity, other specified: Secondary | ICD-10-CM | POA: Diagnosis not present

## 2014-12-23 DIAGNOSIS — S4991XA Unspecified injury of right shoulder and upper arm, initial encounter: Secondary | ICD-10-CM | POA: Insufficient documentation

## 2014-12-23 DIAGNOSIS — Y998 Other external cause status: Secondary | ICD-10-CM | POA: Diagnosis not present

## 2014-12-23 DIAGNOSIS — Y92410 Unspecified street and highway as the place of occurrence of the external cause: Secondary | ICD-10-CM | POA: Insufficient documentation

## 2014-12-23 DIAGNOSIS — S199XXA Unspecified injury of neck, initial encounter: Secondary | ICD-10-CM | POA: Diagnosis not present

## 2014-12-23 DIAGNOSIS — Z79899 Other long term (current) drug therapy: Secondary | ICD-10-CM | POA: Diagnosis not present

## 2014-12-23 DIAGNOSIS — M542 Cervicalgia: Secondary | ICD-10-CM

## 2014-12-23 DIAGNOSIS — S4992XA Unspecified injury of left shoulder and upper arm, initial encounter: Secondary | ICD-10-CM | POA: Insufficient documentation

## 2014-12-23 DIAGNOSIS — R51 Headache: Secondary | ICD-10-CM

## 2014-12-23 DIAGNOSIS — Z792 Long term (current) use of antibiotics: Secondary | ICD-10-CM | POA: Insufficient documentation

## 2014-12-23 DIAGNOSIS — S0990XA Unspecified injury of head, initial encounter: Secondary | ICD-10-CM | POA: Diagnosis not present

## 2014-12-23 DIAGNOSIS — R519 Headache, unspecified: Secondary | ICD-10-CM

## 2014-12-23 MED ORDER — ACETAMINOPHEN 500 MG PO TABS
1000.0000 mg | ORAL_TABLET | Freq: Once | ORAL | Status: AC
  Administered 2014-12-23: 1000 mg via ORAL
  Filled 2014-12-23: qty 2

## 2014-12-23 MED ORDER — IBUPROFEN 600 MG PO TABS: 600.0000 mg | ORAL_TABLET | Freq: Four times a day (QID) | ORAL | Status: DC | PRN

## 2014-12-23 MED ORDER — METHOCARBAMOL 500 MG PO TABS: 500.0000 mg | ORAL_TABLET | Freq: Two times a day (BID) | ORAL | Status: DC

## 2014-12-23 MED ORDER — TRAMADOL HCL 50 MG PO TABS: 50.0000 mg | ORAL_TABLET | Freq: Four times a day (QID) | ORAL | Status: DC | PRN
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
## 2015-06-29 ENCOUNTER — Encounter (HOSPITAL_COMMUNITY): Payer: Self-pay | Admitting: *Deleted

## 2015-06-29 ENCOUNTER — Emergency Department (HOSPITAL_COMMUNITY)
Admission: EM | Admit: 2015-06-29 | Discharge: 2015-06-30 | Disposition: A | Discharge status: Home / Self Care | Payer: Medicaid Other | Attending: Emergency Medicine | Admitting: Emergency Medicine

## 2015-06-29 DIAGNOSIS — W57XXXA Bitten or stung by nonvenomous insect and other nonvenomous arthropods, initial encounter: Secondary | ICD-10-CM | POA: Diagnosis not present

## 2015-06-29 DIAGNOSIS — R51 Headache: Secondary | ICD-10-CM | POA: Diagnosis not present

## 2015-06-29 DIAGNOSIS — Y9389 Activity, other specified: Secondary | ICD-10-CM | POA: Insufficient documentation

## 2015-06-29 DIAGNOSIS — S20462A Insect bite (nonvenomous) of left back wall of thorax, initial encounter: Secondary | ICD-10-CM | POA: Diagnosis present

## 2015-06-29 DIAGNOSIS — Z79899 Other long term (current) drug therapy: Secondary | ICD-10-CM | POA: Diagnosis not present

## 2015-06-29 DIAGNOSIS — Y9289 Other specified places as the place of occurrence of the external cause: Secondary | ICD-10-CM | POA: Insufficient documentation

## 2015-06-29 DIAGNOSIS — M542 Cervicalgia: Secondary | ICD-10-CM | POA: Diagnosis not present

## 2015-06-29 DIAGNOSIS — Y998 Other external cause status: Secondary | ICD-10-CM | POA: Insufficient documentation

## 2015-06-29 NOTE — ED Provider Notes (Signed)
CSN: [id number]     Arrival date & time 06/29/15  1815 History   First MD Initiated Contact with Patient 06/29/15 2249     Chief Complaint  Patient presents with  . Insect Bite    HPI   Shelly Dickson is a 49 y.o. female with no pertinent PMH who presents to the ED with insect bite. She states she was outside yesterday when an insect bit the upper left side of her back. She reports she woke up with swelling, and saw her PCP earlier today, at which time she received a steroid injection. She states her symptoms worsened, so she called her PCP and was advised to come to the ED. She reports pain radiating to her left neck, left shoulder, and left arm. She denies fever, chills. She reports headache. She denies dizziness, lightheadedness, vision changes, chest pain, shortness of breath, difficulty swallowing, abdominal pain, N/V/D/C.   Past Medical History  Diagnosis Date  . Fibrocystic breast disease    Past Surgical History  Procedure Laterality Date  . Removal breast mass      Benign   Family History  Problem Relation Age of Onset  . Breast cancer Paternal Grandmother   . Breast cancer Maternal Grandmother   . Hypertension Maternal Grandmother   . Diabetes Maternal Grandfather   . Hypertension Maternal Grandfather   . Hypertension Brother   . Hypertension Sister    Social History  Substance Use Topics  . Smoking status: Never Smoker   . Smokeless tobacco: Never Used  . Alcohol Use: No   OB History    Gravida Para Term Preterm AB TAB SAB Ectopic Multiple Living   2 1 1  0 1 1 0 0 0 1      Review of Systems  Constitutional: Negative for fever and chills.  Eyes: Negative for visual disturbance.  Respiratory: Negative for shortness of breath.   Cardiovascular: Negative for chest pain.  Gastrointestinal: Negative for nausea, vomiting, abdominal pain, diarrhea and constipation.  Musculoskeletal: Positive for myalgias and neck pain.  Skin: Positive for color change.   Neurological: Positive for headaches. Negative for dizziness, syncope, weakness, light-headedness and numbness.  All other systems reviewed and are negative.     Allergies  Chocolate  Home Medications   Prior to Admission medications   Medication Sig Start Date End Date Taking? Authorizing Provider  Cholecalciferol (VITAMIN D) 2000 UNITS tablet Take 2,000 Units by mouth daily.    Yes Historical Provider, MD  Multiple Vitamin (MULITIVITAMIN WITH MINERALS) TABS Take 1 tablet by mouth daily.   Yes Historical Provider, MD  cephALEXin (KEFLEX) 500 MG capsule Take 1 capsule (500 mg total) by mouth 4 (four) times daily. Patient not taking: Reported on 06/29/2015 06/28/12   Carlisle Cater, PA-C  ibuprofen (ADVIL,MOTRIN) 600 MG tablet Take 1 tablet (600 mg total) by mouth every 6 (six) hours as needed for pain. Patient not taking: Reported on 06/29/2015 06/28/12   Carlisle Cater, PA-C  ibuprofen (ADVIL,MOTRIN) 600 MG tablet Take 1 tablet (600 mg total) by mouth every 6 (six) hours as needed. Patient not taking: Reported on 06/29/2015 12/23/14   Noland Fordyce, PA-C  methocarbamol (ROBAXIN) 500 MG tablet Take 1 tablet (500 mg total) by mouth 2 (two) times daily. Patient not taking: Reported on 06/29/2015 12/23/14   Noland Fordyce, PA-C  traMADol (ULTRAM) 50 MG tablet Take 1 tablet (50 mg total) by mouth every 6 (six) hours as needed for pain. Patient not taking: Reported on 06/29/2015 06/28/12  Carlisle Cater, PA-C  traMADol (ULTRAM) 50 MG tablet Take 1 tablet (50 mg total) by mouth every 6 (six) hours as needed. Patient not taking: Reported on 06/29/2015 12/23/14   Noland Fordyce, PA-C    BP 111/74 mmHg  Pulse 73  Temp(Src) 98.5 F (36.9 C) (Oral)  Resp 16  SpO2 100%  LMP 06/22/2015 Physical Exam  Constitutional: She is oriented to person, place, and time. She appears well-developed and well-nourished. No distress.  HENT:  Head: Normocephalic and atraumatic.  Right Ear: External ear normal.   Left Ear: External ear normal.  Nose: Nose normal.  Mouth/Throat: Uvula is midline, oropharynx is clear and moist and mucous membranes are normal.  Eyes: Conjunctivae, EOM and lids are normal. Pupils are equal, round, and reactive to light. Right eye exhibits no discharge. Left eye exhibits no discharge. No scleral icterus.  Neck: Normal range of motion. Neck supple.  Cardiovascular: Normal rate, regular rhythm, normal heart sounds, intact distal pulses and normal pulses.   Pulmonary/Chest: Effort normal and breath sounds normal. No respiratory distress. She has no wheezes. She has no rales. She exhibits tenderness.  Mild TTP over left lateral anterior chest wall.  Abdominal: Soft. Normal appearance and bowel sounds are normal. She exhibits no distension and no mass. There is no tenderness. There is no rigidity, no rebound and no guarding.  Musculoskeletal: Normal range of motion. She exhibits tenderness. She exhibits no edema.  Large area of induration and erythema left upper back with TTP. No fluctuance.   Neurological: She is alert and oriented to person, place, and time. She has normal strength. No sensory deficit.  Skin: Skin is warm, dry and intact. No rash noted. She is not diaphoretic. There is erythema. No pallor.  Psychiatric: She has a normal mood and affect. Her speech is normal and behavior is normal. Judgment and thought content normal.  Nursing note and vitals reviewed.   ED Course  Procedures (including critical care time)  Labs Review Labs Reviewed  I-STAT CHEM 8, ED - Abnormal; Notable for the following:    Hemoglobin 11.9 (*)    HCT 35.0 (*)    All other components within normal limits  I-STAT TROPOININ, ED    Imaging Review No results found.   I have personally reviewed and evaluated these lab results as part of my medical decision-making.   EKG Interpretation   Date/Time:  Monday June 29 2015 18:22:52 EDT Ventricular Rate:  91 PR Interval:  147 QRS  Duration: 85 QT Interval:  339 QTC Calculation: 417 R Axis:   44 Text Interpretation:  Sinus rhythm Confirmed by [facility]  MD, APRIL  ([id number]) on 06/30/2015 12:13:13 AM      MDM   Final diagnoses:  Insect bite    49 year old presents with insect bite to her left upper back with associated swelling and radiation of pain to her left neck, shoulder, and arm. She denies fever, chills, lightheadedness, dizziness, chest pain, shortness of breath, difficulty swallowing, abdominal pain, N/V.  Patient is afebrile. Vital signs stable. O2 sat 100% on RA. Posterior oropharynx clear. Heart RRR. Lungs clear to auscultation bilaterally. Abdomen soft, non-tender, non-distended. Large area of induration and erythema to left upper back with TTP. No fluctuance. Full range of motion of left upper extremity. Strength and sensation intact. Distal pulses intact.  Will treat with toradol, benadryl, pepcid, decadron. Heart score 1 given age. EKG no acute ischemia. Troponin negative x 1. Chem 8 unremarkable. Feel patient is stable for  discharge at this time. Will treat with ibuprofen, benadryl, pepcid, and prednisone. Patient to follow-up with PCP. Return precautions discussed at length.  BP 114/73 mmHg  Pulse 63  Temp(Src) 98.5 F (36.9 C) (Oral)  Resp 18  SpO2 100%  LMP 06/22/2015   Marella Chimes, PA-C 06/30/15 1203  April Palumbo, MD 06/30/15 2307

## 2015-06-29 NOTE — ED Notes (Signed)
Pt reports insect bite to her L upper back last night, is unsure what bit her.  Pt reports the area is swollen and warm.  Pain is radiating to her L arm and L side of her neck and L upper chest.  Pt also reports pain is radiating L upper chest area.  She was seen at [facility] office today and was given a steroid injection.

## 2015-06-30 LAB — I-STAT CHEM 8, ED
BUN: 14 mg/dL (ref 6–20)
Calcium, Ion: 1.19 mmol/L (ref 1.12–1.23)
Chloride: 104 mmol/L (ref 101–111)
Creatinine, Ser: 0.6 mg/dL (ref 0.44–1.00)
GLUCOSE: 90 mg/dL (ref 65–99)
HCT: 35 % — ABNORMAL LOW (ref 36.0–46.0)
Hemoglobin: 11.9 g/dL — ABNORMAL LOW (ref 12.0–15.0)
Potassium: 4 mmol/L (ref 3.5–5.1)
SODIUM: 142 mmol/L (ref 135–145)
TCO2: 24 mmol/L (ref 0–100)

## 2015-06-30 LAB — I-STAT TROPONIN, ED: TROPONIN I, POC: 0 ng/mL (ref 0.00–0.08)

## 2015-06-30 MED ORDER — PREDNISONE 20 MG PO TABS: 40.0000 mg | ORAL_TABLET | Freq: Every day | ORAL | Status: DC

## 2015-06-30 MED ORDER — IBUPROFEN 800 MG PO TABS: 800.0000 mg | ORAL_TABLET | Freq: Three times a day (TID) | ORAL | Status: DC

## 2015-06-30 MED ORDER — FAMOTIDINE 20 MG PO TABS
20.0000 mg | ORAL_TABLET | Freq: Once | ORAL | Status: AC
  Administered 2015-06-30: 20 mg via ORAL
  Filled 2015-06-30: qty 1

## 2015-06-30 MED ORDER — DIPHENHYDRAMINE HCL 25 MG PO CAPS
25.0000 mg | ORAL_CAPSULE | ORAL | Status: AC
  Administered 2015-06-30: 25 mg via ORAL
  Filled 2015-06-30: qty 1

## 2015-06-30 MED ORDER — FAMOTIDINE 20 MG PO TABS: 20.0000 mg | ORAL_TABLET | Freq: Two times a day (BID) | ORAL | Status: AC

## 2015-06-30 MED ORDER — DEXAMETHASONE SODIUM PHOSPHATE 10 MG/ML IJ SOLN
10.0000 mg | Freq: Once | INTRAMUSCULAR | Status: AC
  Administered 2015-06-30: 10 mg via INTRAMUSCULAR
  Filled 2015-06-30: qty 1

## 2015-06-30 MED ORDER — DIPHENHYDRAMINE HCL 25 MG PO TABS: 25.0000 mg | ORAL_TABLET | Freq: Four times a day (QID) | ORAL | Status: DC

## 2015-06-30 MED ORDER — KETOROLAC TROMETHAMINE 30 MG/ML IJ SOLN
30.0000 mg | Freq: Once | INTRAMUSCULAR | Status: AC
  Administered 2015-06-30: 30 mg via INTRAMUSCULAR
  Filled 2015-06-30: qty 1

## 2015-06-30 NOTE — ED Provider Notes (Signed)
Medical screening examination/treatment/procedure(s) were performed by non-physician practitioner and as supervising physician I was immediately available for consultation/collaboration.   EKG Interpretation   Date/Time:  Monday June 29 2015 18:22:52 EDT Ventricular Rate:  91 PR Interval:  147 QRS Duration: 85 QT Interval:  339 QTC Calculation: 417 R Axis:   44 Text Interpretation:  Sinus rhythm Confirmed by [facility]  MD, Carlas Vandyne  ([id number]) on 06/30/2015 12:13:13 AM       Emmilyn Crooke, MD 06/30/15 6681

## 2015-06-30 NOTE — Discharge Instructions (Signed)
1. Medications: ibuprofen (anti-inflammatory), steroid, pepcid, benadryl, usual home medications 2. Treatment: rest, drink plenty of fluids  3. Follow Up: please followup with your primary doctor in 2-3 days for discussion of your diagnoses and further evaluation after today's visit 4. Please return to the ER for high fever, severe headache, trouble swallowing, chest pain, shortness of breath, new or worsening symptoms   Emergency Department Resource Guide 1) Find a Doctor and Pay Out of Pocket Although you won't have to find out who is covered by your insurance plan, it is a good idea to ask around and get recommendations. You will then need to call the office and see if the doctor you have chosen will accept you as a new patient and what types of options they offer for patients who are self-pay. Some doctors offer discounts or will set up payment plans for their patients who do not have insurance, but you will need to ask so you aren't surprised when you get to your appointment.  2) Contact Your Local Health Department Not all health departments have doctors that can see patients for sick visits, but many do, so it is worth a call to see if yours does. If you don't know where your local health department is, you can check in your phone book. The CDC also has a tool to help you locate your state's health department, and many state websites also have listings of all of their local health departments.  3) Find a [facility] If your illness is not likely to be very severe or complicated, you may want to try a walk in clinic. These are popping up all over the country in pharmacies, drugstores, and shopping centers. They're usually staffed by nurse practitioners or physician assistants that have been trained to treat common illnesses and complaints. They're usually fairly quick and inexpensive. However, if you have serious medical issues or chronic medical problems, these are probably not your best  option.  No Primary Care Doctor: - Call Health Connect at  [phone] - they can help you locate a primary care doctor that  accepts your insurance, provides certain services, etc. - Physician Referral Service- [phone]  Chronic Pain Problems: Organization         Address  Phone   Notes  [facility]  [phone] Patients need to be referred by their primary care doctor.   Medication Assistance: Organization         Address  Phone   Notes  [facility] Medication [facility] [address] [place], [place] [address] [phone] --Must be a resident of [facility] -- Must have NO insurance coverage whatsoever (no Medicaid/ Medicare, etc.) -- The pt. MUST have a primary care doctor that directs their care regularly and follows them in the community   MedAssist  [phone]   [employer]  [phone]    Agencies that provide inexpensive medical care: Organization         Address  Phone   Notes  [place]  [phone]   [facility]    [phone]   [facility] [place], [place] [address] [phone]   [place] [address], [place] [phone]   Planned Parenthood    [phone]   [facility]    [phone]   [place], [place]  Phone:  [phone], Fax:  [phone] Hours of Operation:  9 am - 6 pm, M-F.  Also accepts Medicaid/Medicare and self-pay.  [facility] for [place], [address], [place] Phone: [phone], Fax: [phone]. Hours of Operation:  8:30 am - 5:30 pm, M-F.  Also accepts Medicaid and self-pay.  [facility] [place] [address] [place] Phone: [phone]   [place], [place], [place] [phone], Ext. [phone] Mondays & Thursdays: 7-9 AM.  First 15  patients are seen on a first come, first serve basis.    [place] Providers:  Organization         Address  Phone   Notes  [facility] [address], [place] [phone] Also accepts self-pay patients.  [facility] [address], [place]  [phone]   [place], [address], [place] [phone]   [facility] [address], [place] [phone]   Lucianne Lei [address], [place]   [phone] Only accepts [place] Access [place] patients after they have their name applied to their card.   Self-Pay (no insurance) in [facility]:  Organization         Address  Phone   Notes  Sickle Cell Patients, [facility] Internal Medicine [place] [phone]   [facility] [place] [phone]   [facility] [place]  [place], [place], [place] [phone]   [facility]/Dr. Osei-Bonsu  [address], [place] or [address], [place] [phone] Phone number for both [place] and [place] locations is the same.  [facility] [address], [place] [phone]   [facility] [address], [place] or [address] [phone]   [facility] [address] [place] [phone], phone; [phone], fax Sees patients 1st and 3rd Saturday of every month.  Must not qualify for public or private insurance (i.e. Medicaid, Medicare, [place] Health Choice, Veterans' Benefits)  Household income should be no more than 200% of the poverty level The clinic cannot treat you if you are pregnant or think you are pregnant  Sexually transmitted diseases are not treated at the clinic.    Dental  Care: Organization         Address  Phone  Notes  [facility] [place] [phone] Accepts children up to age 37 who are enrolled in [place] or [place]; pregnant women with a Medicaid card; and children who have applied for Medicaid or [place] Health Choice, but were declined, whose parents can pay a reduced fee at time of service.  [facility] Department of [facility]  [address], [place] [phone] Accepts children up to age 70 who are enrolled in [place] or [place]; pregnant women with a Medicaid card; and children who have applied for Medicaid or [place] Health Choice, but were declined, whose parents can pay a reduced fee at time of service.  [place] Adult [facility] Access PRO[facility]  [place] [phone] Patients are seen by appointment  only. Walk-ins are not accepted. [place] will see patients 27 years of age and older. Monday - Tuesday (8am-5pm) Most Wednesdays (8:30-5pm) $30 per visit, cash only  [facility] Adult Dental Access PROGRAM  [address], [facility] [phone] Patients are seen by appointment only. Walk-ins are not accepted. [place] will see patients 78 years of age and older. One Wednesday Evening (Monthly: Volunteer Based).  $30 per visit, cash only  [place]  [phone] for adults; Children under age 8, call Graduate Pediatric Dentistry at [phone]. Children aged 27-14, please call [phone] to request a pediatric application.  Dental services are provided in all areas of dental care including fillings, crowns and bridges, complete and partial dentures, implants, gum treatment, root canals, and extractions. Preventive care is also provided. Treatment is provided to both adults and children. Patients are selected via a lottery and there is often a waiting list.   [facility] [address], [place]  [phone] [email or web address]   [facility] [address] [place], [place] [phone], Ext. [phone] Second and Fourth Thursday of each month, opens at 6:30 AM; Clinic ends at 9 AM.  Patients are seen on a first-come first-served basis, and a limited number are seen during each clinic.   [facility]  [address] Hillard Danker [place], [place] [phone]   Eligibility Requirements You must have lived in [place], [place], or [place] counties for at least the last three months.   You cannot be eligible for state or federal sponsored [employer], including [employer], [place], or [employer].   You generally cannot be eligible for healthcare insurance through your employer.    How to apply: Eligibility screenings are held every Tuesday and Wednesday afternoon from 1:00 pm until 4:00 pm. You do not need an appointment for the interview!  [facility] [address] [place], [place]   [place]  [place] Department  [place]  [phone]    Behavioral Health Resources in the Community: Intensive Outpatient Programs Organization         Address  Phone  Notes  [place]. [address], [place], [place] [phone]   [facility] Outpatient [address] [place], [place]   ADS: Alcohol & Drug S[facility] [address] [place], [place]   [place] [address]  [place], [place] or [phone]   Substance Abuse Resources Organization         Address  Phone  Notes  Alcohol and Drug Services  [phone]   [place]  [phone]   [facility] [place]   Chinita Pester  [phone]   Residential & Outpatient Substance Abuse Program  [phone]    Psychological Services Organization         Address  Phone  Notes  [facility] [place]  [place]  [phone]   [place] [address] [place] [phone] or [phone]    Mobile Crisis Teams Organization         Address  Phone  Notes  [facility], Mobile Crisis Care Unit  [phone]   [facility] Psych[facility]  [address] [place], [place]   [facility] [address] [place] [phone]  Self-Help/Support Groups Organization         Address  Phone             Notes  Mental Health Assoc. of [place] - variety of support groups  [place] Call for more information  Narcotics Anonymous (NA), [facility] [address], [employer] [place]  2 meetings at this location   Special educational needs teacher         Address  Phone  Notes  ASAP Residential Treatment [place],    [place]  [phone]-([phone]) [phone]   [facility]  [address] [place] [address], [place], [place]   [place], [place] [phone] Admissions: 8am-3pm M-F  Incentives Substance [place] [address]    [place], [place] [phone]   [facility] [address] [place], [place], [place]   [facility] [address]  [place], [employer]   Insight Programs - Intensive Outpatient [address], [place], [place]   [facility] ([place].) [place].,  [place], [place] [phone] or [phone]   [facility]idential Treatment Services (RTS) [address] [place], [place] Accepts Medicaid  [facility] [place] [address]  [place] [phone] Substance Abuse/Addiction Treatment   [facility] Organization         Address  Phone  Notes  [facility]  [phone]   Domenic Schwab, PhD [address] [place], [place]   [phone] or [phone]   [place], [place] [phone]   [facility] [address], [place], [place] [phone] Insurance/Medicaid/sponsorship through [facility] and Families [address], [address] [place]                                    [place], [place] [phone] [place] [address], [place] [phone]    Dr. Adele Schilder  [phone]   [facility] Dept. 1) [address], [place] 2) [place] 3)  [place] [address], [place] [phone]  [phone]   [place] [phone] or [phone] (After Hours)

## 2016-06-10 ENCOUNTER — Other Ambulatory Visit: Payer: Self-pay | Admitting: Nurse Practitioner

## 2016-06-10 DIAGNOSIS — Z1231 Encounter for screening mammogram for malignant neoplasm of breast: Secondary | ICD-10-CM

## 2016-06-21 ENCOUNTER — Inpatient Hospital Stay: Admission: RE | Admit: 2016-06-21 | Payer: Medicaid Other | Source: Ambulatory Visit

## 2016-08-08 ENCOUNTER — Ambulatory Visit
Admission: RE | Admit: 2016-08-08 | Discharge: 2016-08-08 | Disposition: A | Discharge status: Home / Self Care | Payer: Medicaid Other | Source: Ambulatory Visit | Attending: Nurse Practitioner | Admitting: Nurse Practitioner

## 2016-08-08 DIAGNOSIS — Z1231 Encounter for screening mammogram for malignant neoplasm of breast: Secondary | ICD-10-CM

## 2017-10-06 ENCOUNTER — Other Ambulatory Visit: Payer: Self-pay | Admitting: Nurse Practitioner

## 2017-10-06 DIAGNOSIS — Z1231 Encounter for screening mammogram for malignant neoplasm of breast: Secondary | ICD-10-CM

## 2017-10-09 ENCOUNTER — Ambulatory Visit
Admission: RE | Admit: 2017-10-09 | Discharge: 2017-10-09 | Disposition: A | Discharge status: Home / Self Care | Payer: PRIVATE HEALTH INSURANCE | Source: Ambulatory Visit | Attending: Nurse Practitioner | Admitting: Nurse Practitioner

## 2017-10-09 DIAGNOSIS — Z1231 Encounter for screening mammogram for malignant neoplasm of breast: Secondary | ICD-10-CM

## 2018-03-12 ENCOUNTER — Other Ambulatory Visit: Payer: Self-pay

## 2018-03-12 ENCOUNTER — Encounter: Payer: Self-pay | Admitting: Physician Assistant

## 2018-03-12 ENCOUNTER — Ambulatory Visit (INDEPENDENT_AMBULATORY_CARE_PROVIDER_SITE_OTHER): Payer: Self-pay | Admitting: Physician Assistant

## 2018-03-12 VITALS — BP 110/78 | HR 62 | Temp 97.6°F | Resp 16 | Ht 68.0 in | Wt 225.0 lb

## 2018-03-12 DIAGNOSIS — Z0289 Encounter for other administrative examinations: Secondary | ICD-10-CM

## 2018-03-12 NOTE — Patient Instructions (Addendum)
  It was a pleasure meeting you today.   IF you received an x-ray today, you will receive an invoice from [facility]. Please contact [facility] at [phone] with questions or concerns regarding your invoice.   IF you received labwork today, you will receive an invoice from [place]. Please contact [facility] at [phone] with questions or concerns regarding your invoice.   Our billing staff will not be able to assist you with questions regarding bills from these companies.  You will be contacted with the lab results as soon as they are available. The fastest way to get your results is to activate your My Chart account. Instructions are located on the last page of this paperwork. If you have not heard from [place] regarding the results in 2 weeks, please contact this office.

## 2018-03-12 NOTE — Progress Notes (Signed)
03/12/2018 9:47 AM   DOB: 06-29-1966 / MRN: [id number]  SUBJECTIVE:  Shelly Dickson is a 52 y.o. female presenting for an employment physical.  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  Takes no other prescription meds.  Denies any pain in her back or difficulty with lifting.  Denies any pain today.  Immunization History  Administered Date(s) Administered  . Influenza Whole 06/12/2008, 07/20/2009  . Td 09/12/2006    Health Maintenance Due  Topic  . HIV Screening   . PAP SMEAR   . TETANUS/TDAP   . COLONOSCOPY     She is allergic to bee venom and chocolate.   She  has a past medical history of Fibrocystic breast disease.    She  reports that she has never smoked. She has never used smokeless tobacco. She reports that she does not drink alcohol or use drugs. She  reports that she currently engages in sexual activity. She reports using the following method of birth control/protection: None. The patient  has a past surgical history that includes removal breast mass.  Her family history includes Breast cancer in her maternal grandmother and paternal grandmother; Diabetes in her maternal grandfather; Hypertension in her brother, maternal grandfather, maternal grandmother, and sister.  Review of Systems  Constitutional: Negative for chills, diaphoresis and fever.  Eyes: Negative.   Respiratory: Negative for cough, hemoptysis, sputum production, shortness of breath and wheezing.   Cardiovascular: Negative for chest pain, orthopnea and leg swelling.  Gastrointestinal: Negative for abdominal pain, blood in stool, constipation, diarrhea, heartburn, melena, nausea and vomiting.  Genitourinary: Negative for dysuria, flank pain, frequency, hematuria and urgency.  Skin: Negative for rash.  Neurological: Negative for dizziness, sensory change, speech change, focal weakness and headaches.    Problem list and  medications reviewed and updated by myself where necessary, and exist elsewhere in the encounter.   OBJECTIVE:  BP 110/78   Pulse 62   Temp 97.6 F (36.4 C)   Resp 16   Ht 5\' 8"  (1.727 m)   Wt 225 lb (102.1 kg)   SpO2 98%   BMI 34.21 kg/m   Physical Exam  Constitutional: She is oriented to person, place, and time. She appears well-nourished.  Non-toxic appearance. No distress.  Eyes: Pupils are equal, round, and reactive to light. EOM are normal.  Cardiovascular: Normal rate, regular rhythm, S1 normal, S2 normal, normal heart sounds and intact distal pulses. Exam reveals no gallop, no friction rub and no decreased pulses.  No murmur heard. Pulmonary/Chest: Effort normal. No stridor. No respiratory distress. She has no wheezes. She has no rales.  Abdominal: She exhibits no distension.  Musculoskeletal: She exhibits no edema.  Neurological: She is alert and oriented to person, place, and time. No cranial nerve deficit. Gait normal.  Skin: Skin is warm and dry. She is not diaphoretic. No pallor.  Psychiatric: She has a normal mood and affect. Her behavior is normal. Judgment and thought content normal.  Vitals reviewed.   Lab Results  Component Value Date   WBC 6.1 12/19/2011   HGB 11.9 (L) 06/30/2015   HCT 35.0 (L) 06/30/2015   MCV 89.9 12/19/2011   PLT 233 12/19/2011    Lab Results  Component Value Date   NA 142 06/30/2015   K 4.0 06/30/2015   CL 104 06/30/2015    Lab Results  Component Value Date   CREATININE 0.60 06/30/2015  ASSESSMENT AND PLAN  Shelly Dickson was seen today for establish care.  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  Has a history of dysthymia thought to be secondary to perimenopause/menopause however takes 40 mg of Prozac daily and this is been well controlled.  Form completed and signed by me.    The patient was advised to call or return to  clinic if she does not see an improvement in symptoms or to seek the care of the closest emergency department if she worsens with the above plan.   Philis Fendt, MHS, PA-C [facility] 03/12/2018 9:47 AM

## 2018-03-16 IMAGING — MG 2D DIGITAL SCREENING BILATERAL MAMMOGRAM WITH 3D TOMO WITH CAD
9 of 12 series · 9 of 28 positions shown · non-contrast
Comparison: Previous exam(s).

CLINICAL DATA: Screening.

EXAM:
2D DIGITAL SCREENING BILATERAL MAMMOGRAM WITH 3D TOMO WITH CAD

[R MLO synth-2D]
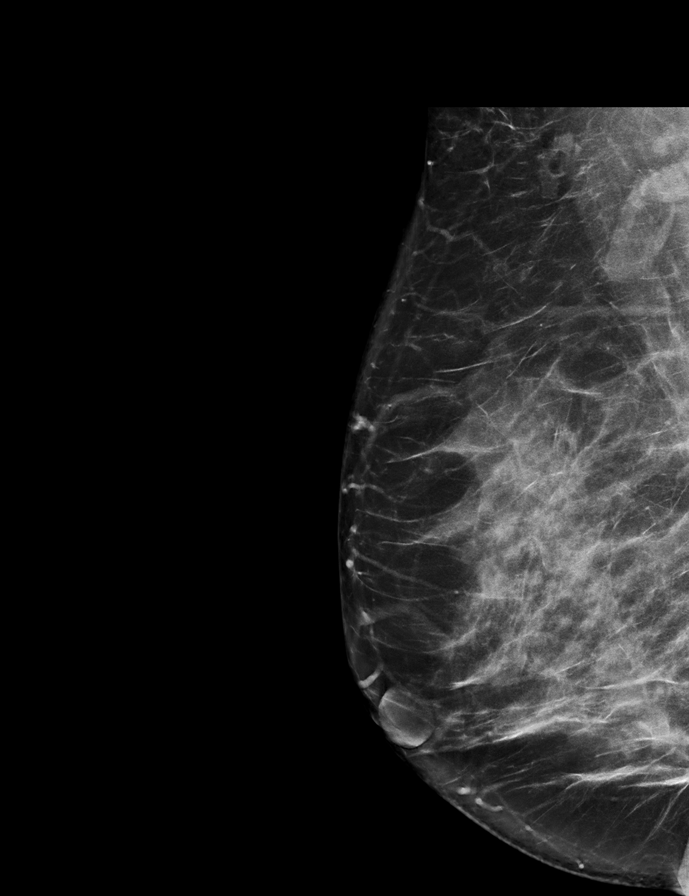

[L CC synth-2D]
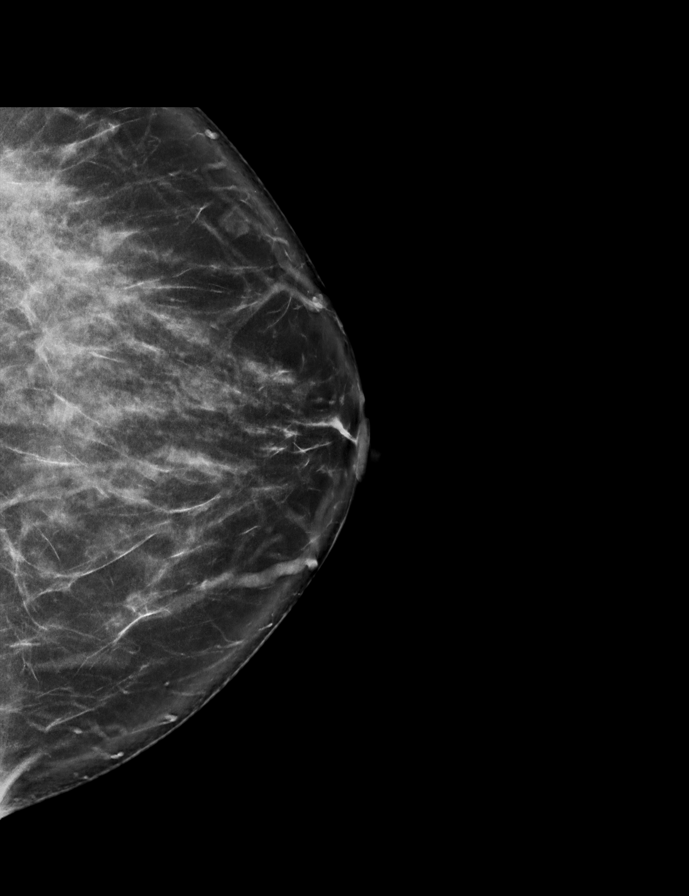

[R CC]
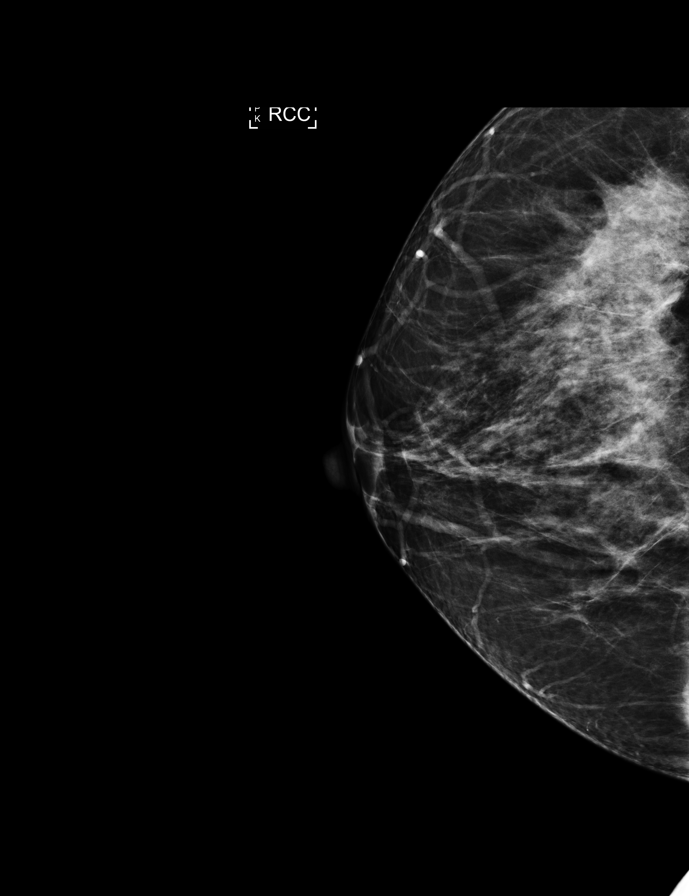

[L MLO]
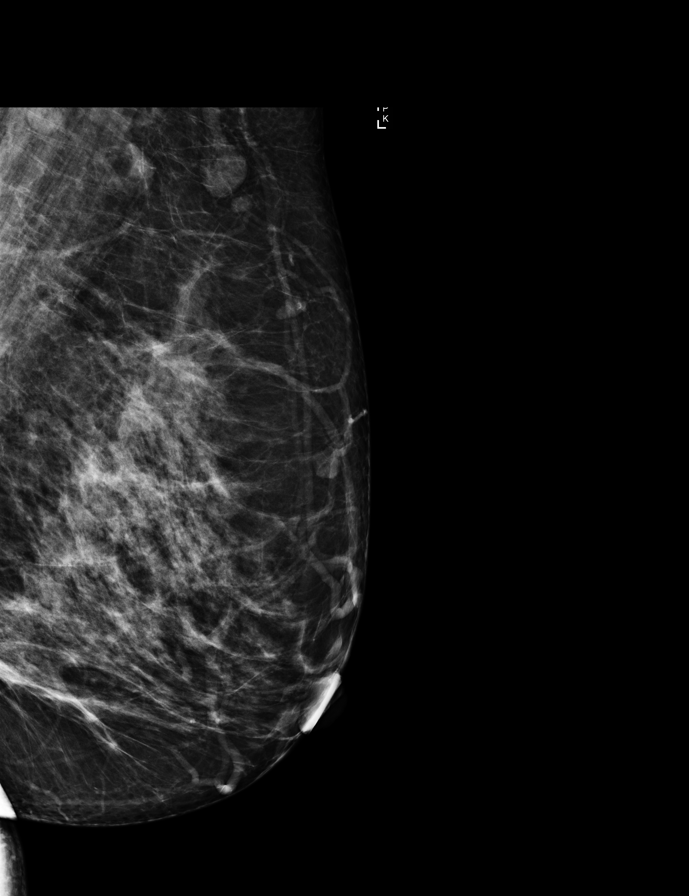

[L MLO synth-2D]
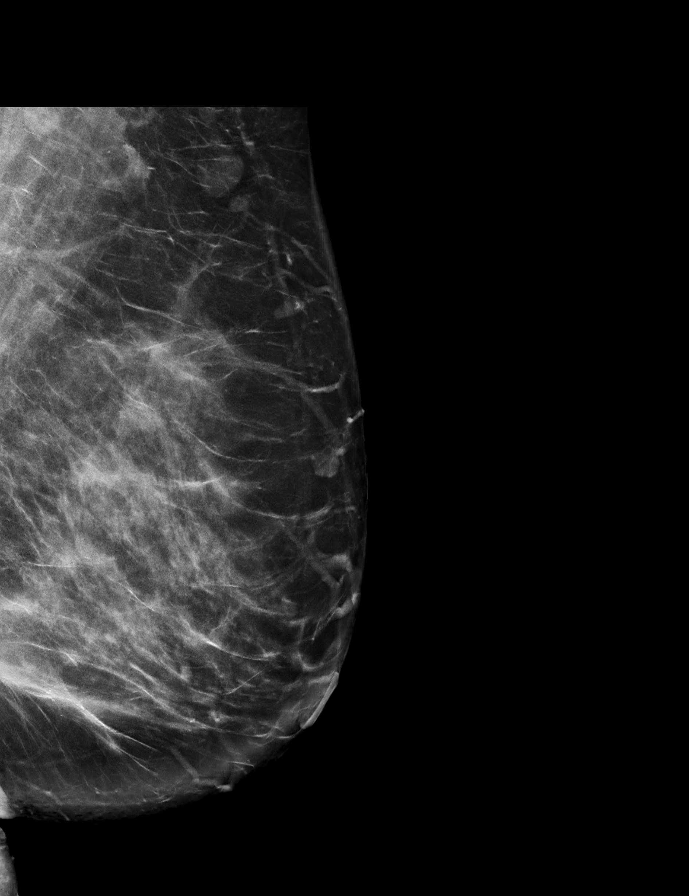

[R CC synth-2D]
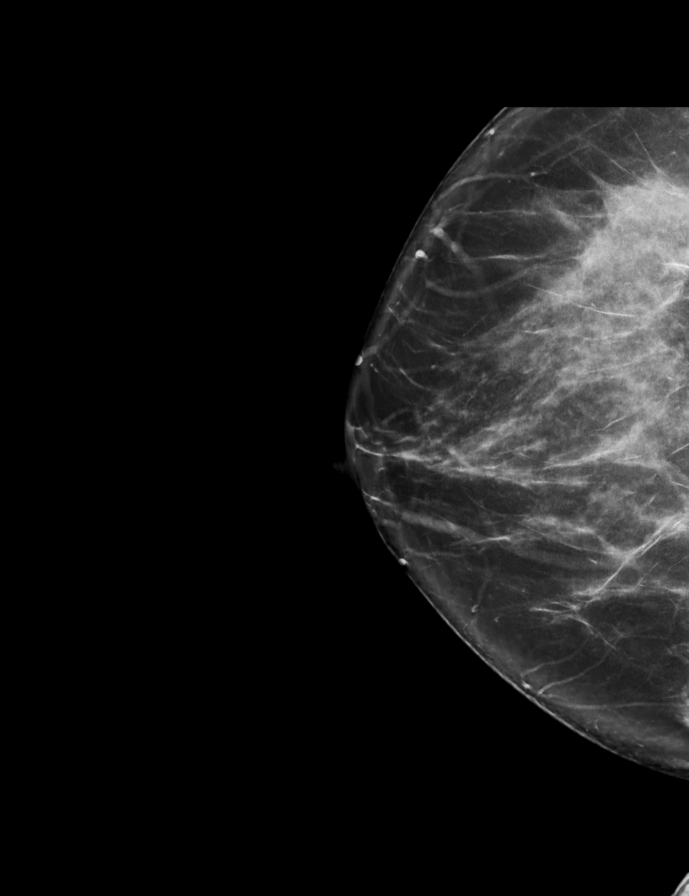

[L CC]
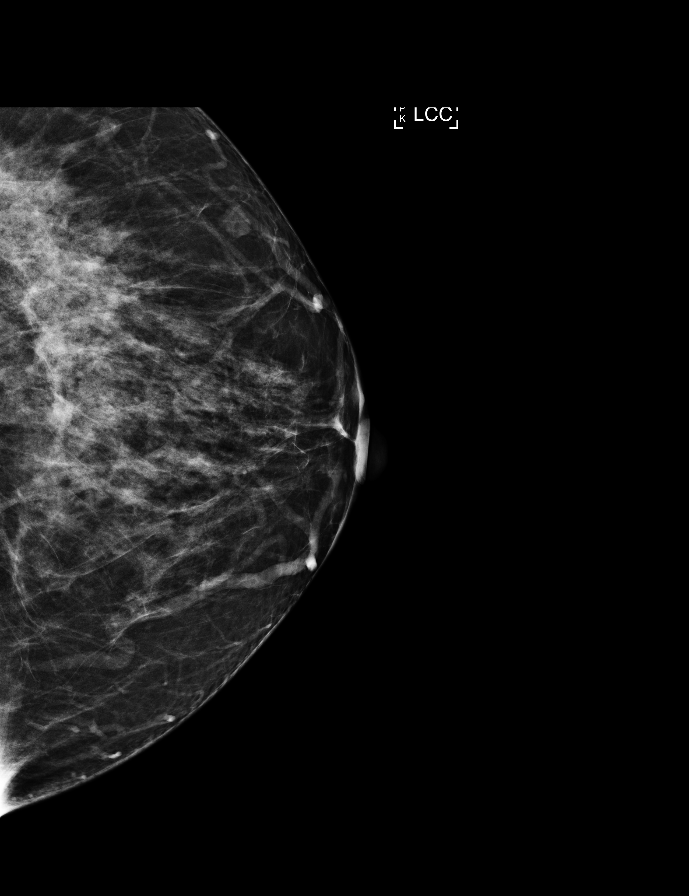

[R MLO]
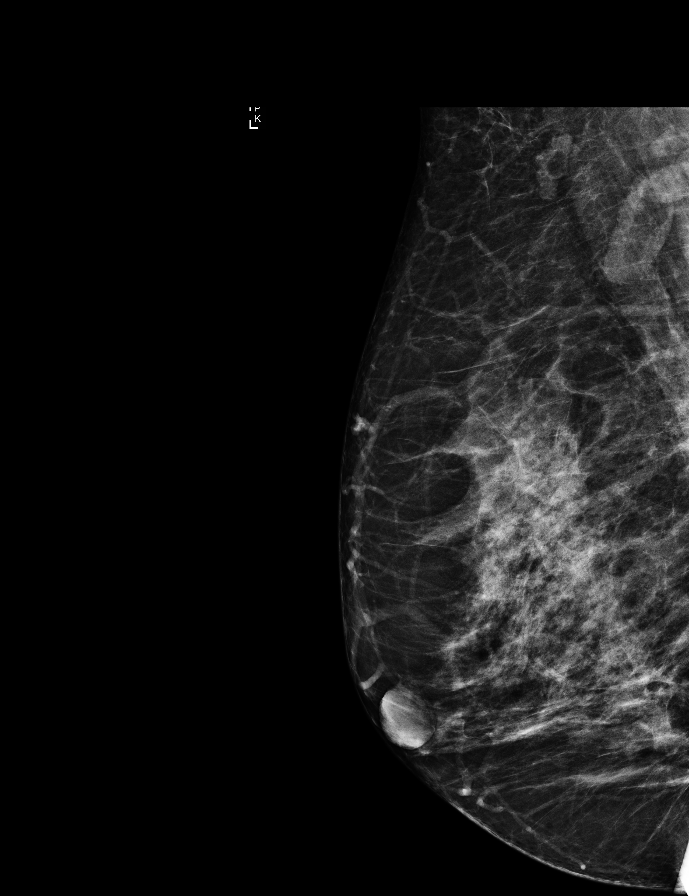

[R CC tomo · tomo slice 42/83.0]
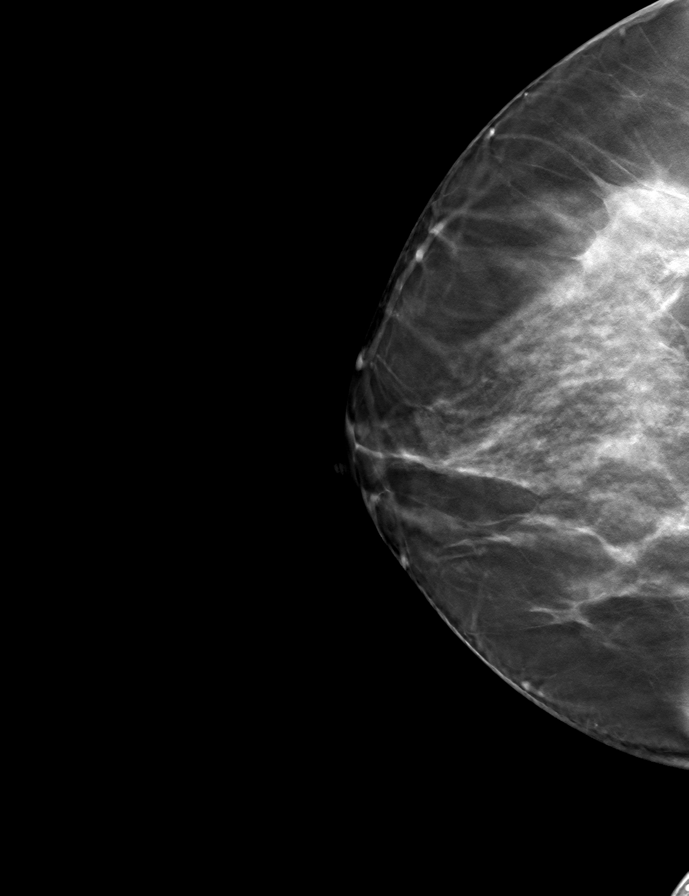

[9 of 28 positions shown; findings below may reference images not displayed]

ACR Breast Density Category c: The breast tissue is heterogeneously
dense, which may obscure small masses.
FINDINGS: There are no findings suspicious for malignancy. Images were
processed with CAD.
IMPRESSION: No mammographic evidence of malignancy. A result letter of this
screening mammogram will be mailed directly to the patient.

RECOMMENDATION:
Screening mammogram in one year. (Code:UA-[id number])

BI-RADS CATEGORY  1: Negative.

## 2018-10-22 ENCOUNTER — Other Ambulatory Visit: Payer: Self-pay | Admitting: Physician Assistant

## 2018-10-22 DIAGNOSIS — Z1231 Encounter for screening mammogram for malignant neoplasm of breast: Secondary | ICD-10-CM

## 2018-11-16 ENCOUNTER — Ambulatory Visit: Payer: PRIVATE HEALTH INSURANCE

## 2019-04-22 ENCOUNTER — Other Ambulatory Visit: Payer: Self-pay

## 2019-04-22 DIAGNOSIS — Z20822 Contact with and (suspected) exposure to covid-19: Secondary | ICD-10-CM

## 2019-04-23 LAB — NOVEL CORONAVIRUS, NAA: SARS-CoV-2, NAA: NOT DETECTED

## 2019-11-14 ENCOUNTER — Ambulatory Visit: Payer: PRIVATE HEALTH INSURANCE | Attending: Family

## 2019-11-14 DIAGNOSIS — Z23 Encounter for immunization: Secondary | ICD-10-CM

## 2019-11-14 NOTE — Progress Notes (Signed)
   Covid-19 Vaccination Clinic  Name:  Shelly Dickson    MRN: [id number] DOB: [birth year]  11/14/2019  [name] was observed post Covid-19 immunization for 15 minutes without incident. She was provided with Vaccine Information Sheet and instruction to access the V-Safe system.   [name] was instructed to call 911 with any severe reactions post vaccine: Marland Kitchen Difficulty breathing  . Swelling of face and throat  . A fast heartbeat  . A bad rash all over body  . Dizziness and weakness   Immunizations Administered    Name Date Dose VIS Date Route   Moderna COVID-19 Vaccine 11/14/2019 12:38 PM 0.5 mL 08/13/2019 Intramuscular   Manufacturer: Moderna   Lot: [id number]   [id number]

## 2019-12-17 ENCOUNTER — Ambulatory Visit: Payer: PRIVATE HEALTH INSURANCE | Attending: Family

## 2019-12-17 DIAGNOSIS — Z23 Encounter for immunization: Secondary | ICD-10-CM

## 2019-12-17 NOTE — Progress Notes (Signed)
   Covid-19 Vaccination Clinic  Name:  DEVRA KOPINSKI    MRN: [id number] DOB: [birth year]  12/17/2019  Ms. Wayson was observed post Covid-19 immunization for 30 minutes based on pre-vaccination screening without incident. She was provided with Vaccine Information Sheet and instruction to access the V-Safe system.   Ms. Coonradt was instructed to call 911 with any severe reactions post vaccine: Marland Kitchen Difficulty breathing  . Swelling of face and throat  . A fast heartbeat  . A bad rash all over body  . Dizziness and weakness   Immunizations Administered    Name Date Dose VIS Date Route   Moderna COVID-19 Vaccine 12/17/2019 12:37 PM 0.5 mL 08/13/2019 Intramuscular   Manufacturer: Moderna   Lot: [id number]   [id number]

## 2022-06-28 ENCOUNTER — Ambulatory Visit
Admission: RE | Admit: 2022-06-28 | Discharge: 2022-06-28 | Disposition: A | Discharge status: Home / Self Care | Payer: PRIVATE HEALTH INSURANCE | Source: Ambulatory Visit | Attending: Adult Health Nurse Practitioner | Admitting: Adult Health Nurse Practitioner

## 2022-06-28 ENCOUNTER — Other Ambulatory Visit: Payer: Self-pay | Admitting: Adult Health Nurse Practitioner

## 2022-06-28 DIAGNOSIS — Z1231 Encounter for screening mammogram for malignant neoplasm of breast: Secondary | ICD-10-CM

## 2022-07-01 ENCOUNTER — Other Ambulatory Visit: Payer: Self-pay | Admitting: Adult Health Nurse Practitioner

## 2022-07-01 DIAGNOSIS — R928 Other abnormal and inconclusive findings on diagnostic imaging of breast: Secondary | ICD-10-CM

## 2022-09-16 ENCOUNTER — Other Ambulatory Visit: Payer: PRIVATE HEALTH INSURANCE

## 2022-09-23 ENCOUNTER — Ambulatory Visit
Admission: RE | Admit: 2022-09-23 | Discharge: 2022-09-23 | Disposition: A | Discharge status: Home / Self Care | Payer: PRIVATE HEALTH INSURANCE | Source: Ambulatory Visit | Attending: Adult Health Nurse Practitioner | Admitting: Adult Health Nurse Practitioner

## 2022-09-23 DIAGNOSIS — R928 Other abnormal and inconclusive findings on diagnostic imaging of breast: Secondary | ICD-10-CM

## 2022-12-24 ENCOUNTER — Emergency Department (HOSPITAL_BASED_OUTPATIENT_CLINIC_OR_DEPARTMENT_OTHER): Payer: 59

## 2022-12-24 ENCOUNTER — Emergency Department (HOSPITAL_BASED_OUTPATIENT_CLINIC_OR_DEPARTMENT_OTHER): Payer: 59 | Admitting: Radiology

## 2022-12-24 ENCOUNTER — Encounter (HOSPITAL_BASED_OUTPATIENT_CLINIC_OR_DEPARTMENT_OTHER): Payer: Self-pay | Admitting: Emergency Medicine

## 2022-12-24 ENCOUNTER — Emergency Department (HOSPITAL_BASED_OUTPATIENT_CLINIC_OR_DEPARTMENT_OTHER)
Admission: EM | Admit: 2022-12-24 | Discharge: 2022-12-24 | Disposition: A | Discharge status: Home / Self Care | Payer: 59 | Attending: Emergency Medicine | Admitting: Emergency Medicine

## 2022-12-24 ENCOUNTER — Other Ambulatory Visit: Payer: Self-pay

## 2022-12-24 DIAGNOSIS — S3992XA Unspecified injury of lower back, initial encounter: Secondary | ICD-10-CM

## 2022-12-24 DIAGNOSIS — W0110XA Fall on same level from slipping, tripping and stumbling with subsequent striking against unspecified object, initial encounter: Secondary | ICD-10-CM | POA: Diagnosis not present

## 2022-12-24 DIAGNOSIS — W19XXXA Unspecified fall, initial encounter: Secondary | ICD-10-CM

## 2022-12-24 DIAGNOSIS — S0003XA Contusion of scalp, initial encounter: Secondary | ICD-10-CM | POA: Insufficient documentation

## 2022-12-24 DIAGNOSIS — M545 Low back pain, unspecified: Secondary | ICD-10-CM | POA: Insufficient documentation

## 2022-12-24 DIAGNOSIS — M542 Cervicalgia: Secondary | ICD-10-CM | POA: Insufficient documentation

## 2022-12-24 DIAGNOSIS — R519 Headache, unspecified: Secondary | ICD-10-CM | POA: Diagnosis not present

## 2022-12-24 DIAGNOSIS — S0990XA Unspecified injury of head, initial encounter: Secondary | ICD-10-CM

## 2022-12-24 HISTORY — DX: Disorder of thyroid, unspecified: E07.9

## 2022-12-24 HISTORY — DX: Patent foramen ovale: Q21.12

## 2022-12-24 MED ORDER — MECLIZINE HCL 25 MG PO TABS: 25.0000 mg | ORAL_TABLET | Freq: Three times a day (TID) | ORAL | 0 refills | Status: AC | PRN

## 2022-12-24 MED ORDER — HYDROCODONE-ACETAMINOPHEN 5-325 MG PO TABS
2.0000 | ORAL_TABLET | Freq: Once | ORAL | Status: AC
  Administered 2022-12-24: 2 via ORAL
  Filled 2022-12-24: qty 2

## 2022-12-24 MED ORDER — HYDROCODONE-ACETAMINOPHEN 5-325 MG PO TABS: 2.0000 | ORAL_TABLET | ORAL | 0 refills | Status: AC | PRN

## 2022-12-24 MED ORDER — HYDROCODONE-ACETAMINOPHEN 5-325 MG PO TABS: 2.0000 | ORAL_TABLET | ORAL | 0 refills | Status: DC | PRN

## 2022-12-24 MED ORDER — CELECOXIB 200 MG PO CAPS: 200.0000 mg | ORAL_CAPSULE | Freq: Two times a day (BID) | ORAL | 0 refills | Status: AC

## 2022-12-24 MED ORDER — MECLIZINE HCL 25 MG PO TABS
25.0000 mg | ORAL_TABLET | Freq: Once | ORAL | Status: AC
  Administered 2022-12-24: 25 mg via ORAL
  Filled 2022-12-24: qty 1

## 2022-12-24 NOTE — Discharge Instructions (Addendum)
Get help right away if: You have: A severe headache that is not helped by medicine. Trouble walking or weakness in your arms and legs. Clear or bloody fluid coming from your nose or ears. Changes in your vision. A seizure. Increased confusion or irritability. Your symptoms get worse. You are sleepier than normal and have trouble staying awake. You lose your balance. Your pupils change size. Your speech is slurred. Your dizziness gets worse. You vomit. These symptoms may represent a serious problem that is an emergency. Do not wait to see if the symptoms will go away. Get medical help right away. Call your local emergency services (911 in the [place] Do not drive yourself to the hospital. 

## 2022-12-24 NOTE — ED Notes (Signed)
RN reviewed discharge instructions with pt. Pt verbalized understanding and had no further questions. VSS upon discharge.  

## 2022-12-24 NOTE — ED Triage Notes (Signed)
Pt was walking backwards, carrying box spring, fell backwards, fell on her butt, then her back and her head was last thing to hit the road.. pt has know on back of head. Kinda in/out of consciousness for few minutes. Pt does endorse headache.

## 2022-12-24 NOTE — ED Provider Notes (Signed)
[place] EMERGENCY DEPARTMENT AT [facility] Provider Note   CSN: [id number] Arrival date & time: 12/24/22  1417     History {Add pertinent medical, surgical, social history, OB history to HPI:1} No chief complaint on file.   Shelly Dickson is a 57 y.o. female who had mechanical fall today.  Patient was helping her niece carry a box bring when she tripped over a rock, fell onto her butt and then slammed to the back of her head on the ground.  Her niece ran over and states that she was in and out of consciousness for a while.  Since that time she has pretty significant headache.  She has some mild neck pain and pain in her tailbone denies upper extremity weakness nausea vomiting changes in vision weakness ataxia or disequilibrium.  She is up-to-date on her tetanus vaccination  HPI     Home Medications Prior to Admission medications   Medication Sig Start Date End Date Taking? Authorizing Provider  Cholecalciferol (VITAMIN D) 2000 UNITS tablet Take 2,000 Units by mouth daily.     [provider]  EPINEPHrine (EPIPEN 2-PAK) 0.3 mg/0.3 mL IJ SOAJ injection Use as directed, if administer call 911 11/02/16   [provider]  famotidine (PEPCID) 20 MG tablet Take 1 tablet (20 mg total) by mouth 2 (two) times daily. 06/30/15   Mady Gemma, PA-C  ferrous sulfate 325 (65 FE) MG tablet Take by mouth.    [provider]  FLUoxetine (PROZAC) 40 MG capsule Take 40 mg by mouth daily. 02/15/18   [provider]  Multiple Vitamin (MULITIVITAMIN WITH MINERALS) TABS Take 1 tablet by mouth daily.    [provider]  vitamin C (ASCORBIC ACID) 500 MG tablet Take 500 mg by mouth daily.    [provider]      Allergies    Bee venom, Chocolate, and Shellfish allergy    Review of Systems   Review of Systems  Physical Exam Updated Vital Signs BP (!) 143/81   Pulse 61   Temp 98.3 F (36.8 C)   Resp 17   SpO2 97%  Physical  Exam Vitals and nursing note reviewed.  Constitutional:      General: She is not in acute distress.    Appearance: She is well-developed. She is not diaphoretic.  HENT:     Head: Normocephalic and atraumatic.     Comments: Large hematoma to the backside of the head with small abrasion no active bleeding    Right Ear: External ear normal.     Left Ear: External ear normal.     Nose: Nose normal.     Mouth/Throat:     Mouth: Mucous membranes are moist.  Eyes:     General: No scleral icterus.    Conjunctiva/sclera: Conjunctivae normal.  Cardiovascular:     Rate and Rhythm: Normal rate and regular rhythm.     Heart sounds: Normal heart sounds. No murmur heard.    No friction rub. No gallop.  Pulmonary:     Effort: Pulmonary effort is normal. No respiratory distress.     Breath sounds: Normal breath sounds.  Abdominal:     General: Bowel sounds are normal. There is no distension.     Palpations: Abdomen is soft. There is no mass.     Tenderness: There is no abdominal tenderness. There is no guarding.  Musculoskeletal:     Cervical back: Normal range of motion.     Comments: Tenderness over  the tailbone, ambulatory  Skin:    General: Skin is warm and dry.  Neurological:     Mental Status: She is alert and oriented to person, place, and time.     Comments: Speech is clear and goal oriented, follows commands Major Cranial nerves without deficit, no facial droop Normal strength in upper and lower extremities bilaterally including dorsiflexion and plantar flexion, strong and equal grip strength Sensation normal to light and sharp touch Moves extremities without ataxia, coordination intact Normal finger to nose and rapid alternating movements Neg romberg, no pronator drift Normal gait Normal heel-shin and balance   Psychiatric:        Behavior: Behavior normal.     ED Results / Procedures / Treatments   Labs (all labs ordered are listed, but only abnormal results are  displayed) Labs Reviewed  PREGNANCY, URINE    EKG None  Radiology CT Head Wo Contrast  Result Date: 12/24/2022 CLINICAL DATA:  Head trauma, abnormal mental status (Age 34-64y); Polytrauma, blunt EXAM: CT HEAD WITHOUT CONTRAST CT CERVICAL SPINE WITHOUT CONTRAST TECHNIQUE: Multidetector CT imaging of the head and cervical spine was performed following the standard protocol without intravenous contrast. Multiplanar CT image reconstructions of the cervical spine were also generated. RADIATION DOSE REDUCTION: This exam was performed according to the departmental dose-optimization program which includes automated exposure control, adjustment of the mA and/or kV according to patient size and/or use of iterative reconstruction technique. COMPARISON:  None Available. FINDINGS: CT HEAD FINDINGS Brain: No evidence of acute infarction, hemorrhage, hydrocephalus, extra-axial collection or mass lesion/mass effect. Vascular: No hyperdense vessel or unexpected calcification. Skull: Normal. Negative for fracture or focal lesion. Sinuses/Orbits: No acute finding. Other: Posterior right parietal scalp hematoma measuring up to 3.0 cm. CT CERVICAL SPINE FINDINGS Alignment: Facet joints are aligned without dislocation or traumatic listhesis. Dens and lateral masses are aligned. Skull base and vertebrae: No acute fracture. No primary bone lesion or focal pathologic process. Soft tissues and spinal canal: No prevertebral fluid or swelling. No visible canal hematoma. Disc levels:  Mild degenerative disc disease of C5-6 and C6-7. Upper chest: Negative. Other: None. IMPRESSION: 1. No acute intracranial abnormality. 2. Posterior right parietal scalp hematoma. No underlying calvarial fracture. 3. No acute fracture or subluxation of the cervical spine. Electronically Signed   By: Duanne Guess D.O.   On: 12/24/2022 16:24   CT Cervical Spine Wo Contrast  Result Date: 12/24/2022 CLINICAL DATA:  Head trauma, abnormal mental status  (Age 23-64y); Polytrauma, blunt EXAM: CT HEAD WITHOUT CONTRAST CT CERVICAL SPINE WITHOUT CONTRAST TECHNIQUE: Multidetector CT imaging of the head and cervical spine was performed following the standard protocol without intravenous contrast. Multiplanar CT image reconstructions of the cervical spine were also generated. RADIATION DOSE REDUCTION: This exam was performed according to the departmental dose-optimization program which includes automated exposure control, adjustment of the mA and/or kV according to patient size and/or use of iterative reconstruction technique. COMPARISON:  None Available. FINDINGS: CT HEAD FINDINGS Brain: No evidence of acute infarction, hemorrhage, hydrocephalus, extra-axial collection or mass lesion/mass effect. Vascular: No hyperdense vessel or unexpected calcification. Skull: Normal. Negative for fracture or focal lesion. Sinuses/Orbits: No acute finding. Other: Posterior right parietal scalp hematoma measuring up to 3.0 cm. CT CERVICAL SPINE FINDINGS Alignment: Facet joints are aligned without dislocation or traumatic listhesis. Dens and lateral masses are aligned. Skull base and vertebrae: No acute fracture. No primary bone lesion or focal pathologic process. Soft tissues and spinal canal: No prevertebral fluid or swelling. No  visible canal hematoma. Disc levels:  Mild degenerative disc disease of C5-6 and C6-7. Upper chest: Negative. Other: None. IMPRESSION: 1. No acute intracranial abnormality. 2. Posterior right parietal scalp hematoma. No underlying calvarial fracture. 3. No acute fracture or subluxation of the cervical spine. Electronically Signed   By: Duanne Guess D.O.   On: 12/24/2022 16:24   DG Sacrum/Coccyx  Result Date: 12/24/2022 CLINICAL DATA:  Fall today. EXAM: SACRUM AND COCCYX - 2+ VIEW COMPARISON:  None Available. FINDINGS: There is no evidence of fracture or other focal bone lesions. IMPRESSION: Negative. Electronically Signed   By: Lupita Raider M.D.    On: 12/24/2022 16:18    Procedures Procedures  {Document cardiac monitor, telemetry assessment procedure when appropriate:1}  Medications Ordered in ED Medications  HYDROcodone-acetaminophen (NORCO/VICODIN) 5-325 MG per tablet 2 tablet (2 tablets Oral Given 12/24/22 1644)    ED Course/ Medical Decision Making/ A&P   {   Click here for ABCD2, HEART and other calculatorsREFRESH Note before signing :1}                          Medical Decision Making Patient here with mechanical fall.  I ordered and independently interpreted imaging including CT head and C-spine as well as sacrum/coccyx plain film, no acute findings.  She does have a large hematoma which is visible on head CT.  I suspect she has some mild concussion symptoms.  Will DC with pain medication, meclizine, anti-inflammatories, return precautions and outpatient follow-up.  Amount and/or Complexity of Data Reviewed Labs: ordered. Radiology: ordered.  Risk Prescription drug management.   ***  {Document critical care time when appropriate:1} {Document review of labs and clinical decision tools ie heart score, Chads2Vasc2 etc:1}  {Document your independent review of radiology images, and any outside records:1} {Document your discussion with family members, caretakers, and with consultants:1} {Document social determinants of health affecting pt's care:1} {Document your decision making why or why not admission, treatments were needed:1} Final Clinical Impression(s) / ED Diagnoses Final diagnoses:  None    Rx / DC Orders ED Discharge Orders     None

## 2023-07-22 ENCOUNTER — Encounter (HOSPITAL_BASED_OUTPATIENT_CLINIC_OR_DEPARTMENT_OTHER): Payer: Self-pay | Admitting: Emergency Medicine

## 2023-07-22 ENCOUNTER — Other Ambulatory Visit: Payer: Self-pay

## 2023-07-22 ENCOUNTER — Emergency Department (HOSPITAL_BASED_OUTPATIENT_CLINIC_OR_DEPARTMENT_OTHER)
Admission: EM | Admit: 2023-07-22 | Discharge: 2023-07-22 | Disposition: A | Discharge status: Home / Self Care | Payer: No Typology Code available for payment source | Attending: Emergency Medicine | Admitting: Emergency Medicine

## 2023-07-22 ENCOUNTER — Telehealth (HOSPITAL_BASED_OUTPATIENT_CLINIC_OR_DEPARTMENT_OTHER): Payer: Self-pay | Admitting: Emergency Medicine

## 2023-07-22 DIAGNOSIS — Z23 Encounter for immunization: Secondary | ICD-10-CM | POA: Diagnosis not present

## 2023-07-22 DIAGNOSIS — S61012A Laceration without foreign body of left thumb without damage to nail, initial encounter: Secondary | ICD-10-CM | POA: Diagnosis not present

## 2023-07-22 DIAGNOSIS — S6992XA Unspecified injury of left wrist, hand and finger(s), initial encounter: Secondary | ICD-10-CM | POA: Diagnosis present

## 2023-07-22 DIAGNOSIS — W25XXXA Contact with sharp glass, initial encounter: Secondary | ICD-10-CM | POA: Diagnosis not present

## 2023-07-22 MED ORDER — OXYCODONE-ACETAMINOPHEN 5-325 MG PO TABS
2.0000 | ORAL_TABLET | Freq: Once | ORAL | Status: AC
  Administered 2023-07-22: 2 via ORAL
  Filled 2023-07-22: qty 2

## 2023-07-22 MED ORDER — LIDOCAINE HCL 2 % IJ SOLN
10.0000 mL | Freq: Once | INTRAMUSCULAR | Status: AC
  Administered 2023-07-22: 200 mg
  Filled 2023-07-22: qty 20

## 2023-07-22 MED ORDER — TETANUS-DIPHTH-ACELL PERTUSSIS 5-2.5-18.5 LF-MCG/0.5 IM SUSY
0.5000 mL | PREFILLED_SYRINGE | Freq: Once | INTRAMUSCULAR | Status: AC
  Administered 2023-07-22: 0.5 mL via INTRAMUSCULAR
  Filled 2023-07-22: qty 0.5

## 2023-07-22 NOTE — ED Triage Notes (Signed)
Slip fall over uneven pavement Fell with glass bottle in hand.  Lac on left thumb. Arterial, pulsing bleeding. Happened around 11PM Tetanus UTD

## 2023-07-22 NOTE — ED Provider Notes (Signed)
[place] EMERGENCY DEPARTMENT AT [facility] Provider Note   CSN: [id number] Arrival date & time: 07/22/23  0006     History {Add pertinent medical, surgical, social history, OB history to HPI:1} Chief Complaint  Patient presents with   Extremity Laceration    Shelly Dickson is a 57 y.o. female.  57 year old female who presents ER today secondary to a laceration.  Patient states that she was walking and tripped fell and cut her right thumb on a piece of glass from a bottle she was carrying.  She had profuse bleeding so she came to ER for further evaluation.  Tetanus was within the last 10 years but probably not within the last 5.  No other injuries.  Bleeding has improved with the pressure dressing in the ER.        Home Medications Prior to Admission medications   Medication Sig Start Date End Date Taking? Authorizing Provider  celecoxib (CELEBREX) 200 MG capsule Take 1 capsule (200 mg total) by mouth 2 (two) times daily. 12/24/22   Arthor Captain, PA-C  Cholecalciferol (VITAMIN D) 2000 UNITS tablet Take 2,000 Units by mouth daily.     [provider]  EPINEPHrine (EPIPEN 2-PAK) 0.3 mg/0.3 mL IJ SOAJ injection Use as directed, if administer call 911 11/02/16   [provider]  famotidine (PEPCID) 20 MG tablet Take 1 tablet (20 mg total) by mouth 2 (two) times daily. 06/30/15   Mady Gemma, PA-C  ferrous sulfate 325 (65 FE) MG tablet Take by mouth.    [provider]  FLUoxetine (PROZAC) 40 MG capsule Take 40 mg by mouth daily. 02/15/18   [provider]  HYDROcodone-acetaminophen (NORCO) 5-325 MG tablet Take 2 tablets by mouth every 4 (four) hours as needed. 12/24/22   Arthor Captain, PA-C  meclizine (ANTIVERT) 25 MG tablet Take 1 tablet (25 mg total) by mouth 3 (three) times daily as needed for dizziness. 12/24/22   Arthor Captain, PA-C  Multiple Vitamin (MULITIVITAMIN WITH MINERALS) TABS Take 1 tablet by mouth daily.     [provider]  vitamin C (ASCORBIC ACID) 500 MG tablet Take 500 mg by mouth daily.    [provider]      Allergies    Bee venom, Chocolate, and Shellfish allergy    Review of Systems   Review of Systems  Physical Exam Updated Vital Signs BP (!) 157/70 (BP Location: Right Arm)   Pulse 62   Temp 98 F (36.7 C) (Oral)   Resp 18   LMP 06/22/2015   SpO2 99%  Physical Exam Vitals and nursing note reviewed.  Constitutional:      Appearance: She is well-developed.  HENT:     Head: Normocephalic and atraumatic.  Cardiovascular:     Rate and Rhythm: Normal rate and regular rhythm.  Pulmonary:     Effort: No respiratory distress.     Breath sounds: No stridor.  Abdominal:     General: There is no distension.  Musculoskeletal:     Cervical back: Normal range of motion.  Skin:    Comments: Almost a full avulsion of a approximately 1.5 x 0.75 area of skin on her lateral left thumb with dusky tissue.  Distal nerve is intact, distal cap refill is intact, no brisk or arterial bleeding but does have some oozing.  No foreign bodies.  Neurological:     Mental Status: She is alert.     ED Results / Procedures / Treatments   Labs (  all labs ordered are listed, but only abnormal results are displayed) Labs Reviewed - No data to display  EKG None  Radiology No results found.  Procedures .Marland KitchenLaceration Repair  Date/Time: 07/22/2023 4:20 AM  Performed by: Marily Memos, MD Authorized by: Marily Memos, MD   Consent:    Consent obtained:  Verbal   Consent given by:  Patient   Risks, benefits, and alternatives were discussed: yes     Risks discussed:  Infection, pain, need for additional repair, poor cosmetic result, retained foreign body, poor wound healing and vascular damage   Alternatives discussed:  No treatment Universal protocol:    Procedure explained and questions answered to patient or proxy's satisfaction: yes     Relevant documents present and  verified: yes     Test results available: yes     Patient identity confirmed:  Verbally with patient Anesthesia:    Anesthesia method:  Nerve block   Block location:  Digital at bae of thumb   Block needle gauge:  27 G   Block anesthetic:  Lidocaine 2% w/o epi   Block injection procedure:  Anatomic landmarks identified, introduced needle, incremental injection, anatomic landmarks palpated and negative aspiration for blood   Block outcome:  Incomplete block Laceration details:    Location:  Finger   Finger location:  L thumb   Length (cm):  4   Depth (mm):  6 Pre-procedure details:    Preparation:  Patient was prepped and draped in usual sterile fashion Exploration:    Imaging outcome: foreign body not noted     Wound exploration: wound explored through full range of motion   Treatment:    Area cleansed with:  Saline and Shur-Clens   Amount of cleaning:  Standard   Irrigation solution:  Sterile saline   Irrigation volume:  100   Irrigation method:  Syringe   Visualized foreign bodies/material removed: no     {Document cardiac monitor, telemetry assessment procedure when appropriate:1}  Medications Ordered in ED Medications  lidocaine (XYLOCAINE) 2 % (with pres) injection 200 mg (200 mg Other Given 07/22/23 0151)  oxyCODONE-acetaminophen (PERCOCET/ROXICET) 5-325 MG per tablet 2 tablet (2 tablets Oral Given 07/22/23 0153)  Tdap (BOOSTRIX) injection 0.5 mL (0.5 mLs Intramuscular Given 07/22/23 0152)    ED Course/ Medical Decision Making/ A&P   {   Click here for ABCD2, HEART and other calculatorsREFRESH Note before signing :1}                              Medical Decision Making Risk Prescription drug management.   ***  {Document critical care time when appropriate:1} {Document review of labs and clinical decision tools ie heart score, Chads2Vasc2 etc:1}  {Document your independent review of radiology images, and any outside records:1} {Document your discussion with  family members, caretakers, and with consultants:1} {Document social determinants of health affecting pt's care:1} {Document your decision making why or why not admission, treatments were needed:1} Final Clinical Impression(s) / ED Diagnoses Final diagnoses:  Laceration of left thumb without foreign body without damage to nail, initial encounter    Rx / DC Orders ED Discharge Orders     None

## 2023-07-22 NOTE — ED Notes (Signed)
arterial
